# Patient Record
Sex: Female | Born: 1990 | Hispanic: Yes | Marital: Single | Smoking: Never smoker
Health system: Southern US, Community
[De-identification: ages and names within clinical notes are randomized; demographics above are authoritative.]

## PROBLEM LIST (undated history)

## (undated) ENCOUNTER — Inpatient Hospital Stay (HOSPITAL_COMMUNITY): Payer: Self-pay

## (undated) DIAGNOSIS — IMO0002 Reserved for concepts with insufficient information to code with codable children: Secondary | ICD-10-CM

## (undated) DIAGNOSIS — Z973 Presence of spectacles and contact lenses: Secondary | ICD-10-CM

## (undated) DIAGNOSIS — M797 Fibromyalgia: Secondary | ICD-10-CM

## (undated) DIAGNOSIS — R87619 Unspecified abnormal cytological findings in specimens from cervix uteri: Secondary | ICD-10-CM

## (undated) DIAGNOSIS — D649 Anemia, unspecified: Secondary | ICD-10-CM

## (undated) DIAGNOSIS — F419 Anxiety disorder, unspecified: Secondary | ICD-10-CM

## (undated) HISTORY — PX: APPENDECTOMY: SHX54

## (undated) HISTORY — PX: INTRAUTERINE DEVICE INSERTION: SHX323

---
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
## 2010-09-08 ENCOUNTER — Encounter: Payer: Self-pay | Admitting: Obstetrics & Gynecology

## 2011-02-01 LAB — URINALYSIS, ROUTINE W REFLEX MICROSCOPIC
Glucose, UA: NEGATIVE
Leukocytes, UA: NEGATIVE
Protein, ur: NEGATIVE
Specific Gravity, Urine: >1.03 — ABNORMAL HIGH
Urobilinogen, UA: 0.2

## 2011-02-01 LAB — URINE MICROSCOPIC-ADD ON

## 2011-02-01 LAB — GC/CHLAMYDIA PROBE AMP, GENITAL
Chlamydia, DNA Probe: NEGATIVE
GC Probe Amp, Genital: NEGATIVE

## 2011-02-01 LAB — WET PREP, GENITAL
Clue Cells Wet Prep HPF POC: NONE SEEN
Trich, Wet Prep: NONE SEEN

## 2011-03-03 ENCOUNTER — Emergency Department (HOSPITAL_COMMUNITY): Payer: Self-pay | Attending: Emergency Medicine

## 2011-03-03 ENCOUNTER — Emergency Department (HOSPITAL_COMMUNITY)
Admission: EM | Admit: 2011-03-03 | Discharge: 2011-03-03 | Disposition: A | Discharge status: Home / Self Care | Payer: Self-pay | Source: Home / Self Care | Attending: Emergency Medicine | Admitting: Emergency Medicine

## 2011-03-03 DIAGNOSIS — M546 Pain in thoracic spine: Secondary | ICD-10-CM | POA: Insufficient documentation

## 2011-03-03 DIAGNOSIS — Z8541 Personal history of malignant neoplasm of cervix uteri: Secondary | ICD-10-CM | POA: Insufficient documentation

## 2011-03-03 DIAGNOSIS — G8929 Other chronic pain: Secondary | ICD-10-CM | POA: Insufficient documentation

## 2011-05-10 NOTE — L&D Delivery Note (Signed)
Delivery Note Progressed quickly to complete dilation and felt urge to push.  At 7:12 AM a viable and healthy female was delivered via Vaginal, Spontaneous Delivery (Presentation: ; Occiput Anterior).  APGAR: 8, 9; weight .   Placenta status: Intact, Spontaneous.  Cord: 3 vessels with the following complications: None.   No difficulty with shoulders.  Anesthesia: None  Episiotomy: None Lacerations:  Abrasion post. Midline at introitus Suture Repair: none Est. Blood Loss (mL): 100  Mom to postpartum.  Baby to nursery-stable.  Tanya Erickson,Tanya Erickson 03/09/2012, 8:00 AM

## 2011-09-21 ENCOUNTER — Other Ambulatory Visit (HOSPITAL_COMMUNITY): Payer: Self-pay | Admitting: Physician Assistant

## 2011-09-21 DIAGNOSIS — Z3689 Encounter for other specified antenatal screening: Secondary | ICD-10-CM

## 2011-09-21 LAB — OB RESULTS CONSOLE RUBELLA ANTIBODY, IGM: Rubella: IMMUNE

## 2011-09-28 ENCOUNTER — Ambulatory Visit (HOSPITAL_COMMUNITY)
Admission: RE | Admit: 2011-09-28 | Discharge: 2011-09-28 | Disposition: A | Discharge status: Home / Self Care | Payer: Medicaid Other | Source: Ambulatory Visit | Attending: Physician Assistant | Admitting: Physician Assistant

## 2011-09-28 DIAGNOSIS — O365990 Maternal care for other known or suspected poor fetal growth, unspecified trimester, not applicable or unspecified: Secondary | ICD-10-CM | POA: Insufficient documentation

## 2011-09-28 DIAGNOSIS — Z3689 Encounter for other specified antenatal screening: Secondary | ICD-10-CM | POA: Insufficient documentation

## 2011-09-29 ENCOUNTER — Other Ambulatory Visit (HOSPITAL_COMMUNITY): Payer: Self-pay | Admitting: Physician Assistant

## 2011-09-29 DIAGNOSIS — Z1389 Encounter for screening for other disorder: Secondary | ICD-10-CM

## 2011-10-01 ENCOUNTER — Ambulatory Visit (HOSPITAL_COMMUNITY)
Admission: AD | Admit: 2011-10-01 | Discharge: 2011-10-03 | Disposition: A | Discharge status: Home / Self Care | Payer: Medicaid Other | Source: Ambulatory Visit | Attending: Surgery | Admitting: Surgery

## 2011-10-01 ENCOUNTER — Encounter (HOSPITAL_COMMUNITY): Payer: Self-pay | Admitting: *Deleted

## 2011-10-01 ENCOUNTER — Inpatient Hospital Stay (HOSPITAL_COMMUNITY): Payer: Medicaid Other

## 2011-10-01 DIAGNOSIS — O99891 Other specified diseases and conditions complicating pregnancy: Secondary | ICD-10-CM | POA: Insufficient documentation

## 2011-10-01 DIAGNOSIS — Z531 Procedure and treatment not carried out because of patient's decision for reasons of belief and group pressure: Secondary | ICD-10-CM | POA: Insufficient documentation

## 2011-10-01 DIAGNOSIS — K3580 Unspecified acute appendicitis: Secondary | ICD-10-CM | POA: Insufficient documentation

## 2011-10-01 DIAGNOSIS — R109 Unspecified abdominal pain: Secondary | ICD-10-CM | POA: Insufficient documentation

## 2011-10-01 HISTORY — DX: Reserved for concepts with insufficient information to code with codable children: IMO0002

## 2011-10-01 HISTORY — DX: Unspecified abnormal cytological findings in specimens from cervix uteri: R87.619

## 2011-10-01 LAB — CBC
HCT: 33.5 % — ABNORMAL LOW (ref 36.0–46.0)
MCHC: 34.3 g/dL (ref 30.0–36.0)
MCV: 88.4 fL (ref 78.0–100.0)
RDW: 12.7 % (ref 11.5–15.5)

## 2011-10-01 LAB — WET PREP, GENITAL: Trich, Wet Prep: NONE SEEN

## 2011-10-01 LAB — URINE MICROSCOPIC-ADD ON

## 2011-10-01 LAB — COMPREHENSIVE METABOLIC PANEL
Albumin: 3 g/dL — ABNORMAL LOW (ref 3.5–5.2)
BUN: 8 mg/dL (ref 6–23)
Creatinine, Ser: 0.67 mg/dL (ref 0.50–1.10)
Total Bilirubin: 0.2 mg/dL — ABNORMAL LOW (ref 0.3–1.2)
Total Protein: 6.2 g/dL (ref 6.0–8.3)

## 2011-10-01 LAB — URINALYSIS, ROUTINE W REFLEX MICROSCOPIC
Glucose, UA: NEGATIVE mg/dL
Protein, ur: NEGATIVE mg/dL
pH: 7 (ref 5.0–8.0)

## 2011-10-01 MED ORDER — LACTATED RINGERS IV SOLN
INTRAVENOUS | Status: DC
  Administered 2011-10-01: via INTRAVENOUS

## 2011-10-01 MED ORDER — GI COCKTAIL ~~LOC~~
30.0000 mL | Freq: Once | ORAL | Status: AC
  Administered 2011-10-01: 30 mL via ORAL
  Filled 2011-10-01: qty 30

## 2011-10-01 NOTE — MAU Note (Signed)
Pt states at 1710 she felt nauseated and vomited.  This was followed by a constant abdominal pain bringing her to tears.

## 2011-10-01 NOTE — MAU Provider Note (Signed)
History     CSN: [id number]  Arrival date and time: 10/01/11 1756   First Provider Initiated Contact with Patient 10/01/11 1912      Chief Complaint  Patient presents with  . Abdominal Pain   HPI 21yo G2P1001 @ 17'0 by 16wk [place] c/o acute onset n/v/abd pain.  At 1700 had severe episode of n/v, multiple episodes, non-bloody.  At 1710, severe, sharp, stabbing abd pain started in periumbilical region.  Relatively constant since then.  Radiates to right side, right back, and sometimes down the legs.  No left sided pain.  Has not had pain like this before.  Took no pain meds.  No further n/v.    No recent heavy tylenol use, alcohol use, unusual/rare foods, unsafe water sources, sick contacts, or travel.  No fevers, chills, rashes, HA, neck stiffness, vision changes, respiratory symptoms, abnl vaginal discharge, urinary symptoms, VB, LOF or ctx.  Has not had any abd or pelvic surgeries.     OB History    Grav Para Term Preterm Abortions TAB SAB Ect Mult Living   2 1 1       1       Past Medical History  Diagnosis Date  . Abnormal Pap smear     History reviewed. No pertinent past surgical history.  Family History  Problem Relation Age of Onset  . Cancer Sister   . Heart disease Paternal Uncle   . Diabetes Maternal Grandmother   . Diabetes Maternal Grandfather   . Heart disease Paternal Grandfather     History  Substance Use Topics  . Smoking status: Never Smoker   . Smokeless tobacco: Not on file  . Alcohol Use: No    Allergies: No Known Allergies  No prescriptions prior to admission    Review of Systems  Constitutional: Negative for fever and chills.  HENT: Negative for sore throat.        Mild, intermittent HA x 2 wks, no medications for it.  Eyes: Negative for blurred vision and double vision.  Respiratory: Negative for cough and shortness of breath.   Cardiovascular: Negative for chest pain and palpitations.  Gastrointestinal: Positive for nausea, vomiting and  abdominal pain. Negative for diarrhea, constipation and blood in stool.  Genitourinary: Negative for dysuria, urgency and hematuria.  Musculoskeletal: Negative for myalgias and joint pain.  Skin: Negative for itching and rash.  Neurological: Negative for dizziness, focal weakness, seizures and loss of consciousness.  Endo/Heme/Allergies: Negative for polydipsia. Does not bruise/bleed easily.  Psychiatric/Behavioral: Negative for depression. The patient is not nervous/anxious.    Physical Exam   Blood pressure 117/69, pulse 89, temperature 97.1 F (36.2 C), temperature source Oral, resp. rate 24, last menstrual period 05/21/2011.  Physical Exam  Constitutional: She appears well-developed. No distress.  HENT:  Head: Normocephalic and atraumatic.  Eyes: Conjunctivae and EOM are normal.  Neck: Normal range of motion. Neck supple.  Cardiovascular: Normal rate, regular rhythm and normal heart sounds.   Respiratory: Effort normal and breath sounds normal.  GI: There is no rebound and no guarding.       Soft, non-distended, no organomegaly, +bowel sounds.  Tenderness to palpation over suprapubic region, fundus (just inferior to umbilicus), RLQ, RUQ and epigastric region.  Musculoskeletal: Normal range of motion. She exhibits no edema.       NO CVA tenderness.  Skin: Skin is warm. She is not diaphoretic.  Psychiatric: She has a normal mood and affect. Her behavior is normal.    MAU Course  Procedures   Assessment and Plan  21yo G2P1001 @ 17'0 by 16wk [place] c/o acute onset n/v/abd pain.  N/v/abd pain: DDx includes pancreas/ovarian pathology, UTI.  Could be severe GERD/gastritis/evolving gastroenteritis.   Unlikely related to pregnancy, though could be severe round ligament pain.  CMP and CBC WNL.  Unlikely appendicitis given afebrile with normal WBCs or gallbladder/liver pathology given nl CMP.  UA, lipase/amylase and pelvic US pending.  FWB: normal FHR on doppler today.    Dispo: pending  [place] results.  Chancy Hurter MD  Care transitioned to [facility] CNM   Assumed care of patient; upon initial inspection pt continues to have right abdominal pain; ambulates gingerly; tenderness with palpation of right side of abdomen; Vaginal exam - cervix closed; white frothy discharge; uncomfortable with exam.  Wet prep:  Trich neg; yeast - neg; clue - many; bacteria - too numerous to count  Results:  Results for orders placed during the hospital encounter of 10/01/11 (from the past 24 hour(s))  AMYLASE     Status: Normal   Collection Time   10/01/11  6:57 PM      Component Value Range   Amylase 51  0 - 105 (U/L)  LIPASE, BLOOD     Status: Normal   Collection Time   10/01/11  6:57 PM      Component Value Range   Lipase 40  11 - 59 (U/L)  CBC     Status: Abnormal   Collection Time   10/01/11  6:57 PM      Component Value Range   WBC 9.1  4.0 - 10.5 (K/uL)   RBC 3.79 (*) 3.87 - 5.11 (MIL/uL)   Hemoglobin 11.5 (*) 12.0 - 15.0 (g/dL)   HCT 16.1 (*) 09.6 - 46.0 (%)   MCV 88.4  78.0 - 100.0 (fL)   MCH 30.3  26.0 - 34.0 (pg)   MCHC 34.3  30.0 - 36.0 (g/dL)   RDW 04.5  40.9 - 81.1 (%)   Platelets 234  150 - 400 (K/uL)  COMPREHENSIVE METABOLIC PANEL     Status: Abnormal   Collection Time   10/01/11  6:57 PM      Component Value Range   Sodium 137  135 - 145 (mEq/L)   Potassium 3.6  3.5 - 5.1 (mEq/L)   Chloride 105  96 - 112 (mEq/L)   CO2 21  19 - 32 (mEq/L)   Glucose, Bld 76  70 - 99 (mg/dL)   BUN 8  6 - 23 (mg/dL)   Creatinine, Ser 9.14  0.50 - 1.10 (mg/dL)   Calcium 8.8  8.4 - 78.2 (mg/dL)   Total Protein 6.2  6.0 - 8.3 (g/dL)   Albumin 3.0 (*) 3.5 - 5.2 (g/dL)   AST 13  0 - 37 (U/L)   ALT 12  0 - 35 (U/L)   Alkaline Phosphatase 53  39 - 117 (U/L)   Total Bilirubin 0.2 (*) 0.3 - 1.2 (mg/dL)   GFR calc non Af Amer >90  >90 (mL/min)   GFR calc Af Amer >90  >90 (mL/min)   Consulted with Dr. Despina Hidden and reviewed pt HPI/Exam>since pt continues to have significant pain  R/O appendicitis  Call from radiologist at [facility] confirmed early appendicitis>Dr. Despina Hidden assumes care of patient.  [facility] 10/01/2011, 7:34 PM

## 2011-10-02 ENCOUNTER — Encounter (HOSPITAL_COMMUNITY): Payer: Self-pay | Admitting: Anesthesiology

## 2011-10-02 ENCOUNTER — Inpatient Hospital Stay (HOSPITAL_COMMUNITY): Payer: Medicaid Other | Admitting: Anesthesiology

## 2011-10-02 ENCOUNTER — Inpatient Hospital Stay (HOSPITAL_COMMUNITY)
Admit: 2011-10-02 | Discharge: 2011-10-02 | Disposition: A | Discharge status: Home / Self Care | Payer: Medicaid Other | Attending: Family | Admitting: Family

## 2011-10-02 ENCOUNTER — Encounter (HOSPITAL_COMMUNITY): Admission: AD | Disposition: A | Discharge status: Home / Self Care | Payer: Self-pay | Source: Ambulatory Visit

## 2011-10-02 ENCOUNTER — Encounter (HOSPITAL_COMMUNITY): Payer: Self-pay | Admitting: Family

## 2011-10-02 DIAGNOSIS — K3580 Unspecified acute appendicitis: Secondary | ICD-10-CM

## 2011-10-02 HISTORY — PX: LAPAROSCOPIC APPENDECTOMY: SHX408

## 2011-10-02 SURGERY — APPENDECTOMY, LAPAROSCOPIC
Anesthesia: General | Site: Abdomen | Wound class: Contaminated

## 2011-10-02 MED ORDER — SODIUM CHLORIDE 0.9 % IV SOLN
3.0000 g | Freq: Once | INTRAVENOUS | Status: AC
  Administered 2011-10-02: 3 g via INTRAVENOUS
  Filled 2011-10-02: qty 3

## 2011-10-02 MED ORDER — VECURONIUM BROMIDE 10 MG IV SOLR
INTRAVENOUS | Status: DC | PRN
  Administered 2011-10-02: 4 mg via INTRAVENOUS
  Administered 2011-10-02: 2 mg via INTRAVENOUS

## 2011-10-02 MED ORDER — SODIUM CHLORIDE 0.9 % IR SOLN
Status: DC | PRN
  Administered 2011-10-02: 1

## 2011-10-02 MED ORDER — ONDANSETRON HCL 4 MG/2ML IJ SOLN
4.0000 mg | Freq: Four times a day (QID) | INTRAMUSCULAR | Status: DC | PRN
  Administered 2011-10-02: 4 mg via INTRAVENOUS
  Filled 2011-10-02: qty 2

## 2011-10-02 MED ORDER — NEOSTIGMINE METHYLSULFATE 1 MG/ML IJ SOLN
INTRAMUSCULAR | Status: DC | PRN
  Administered 2011-10-02: 5 mg via INTRAVENOUS

## 2011-10-02 MED ORDER — KCL IN DEXTROSE-NACL 20-5-0.45 MEQ/L-%-% IV SOLN
INTRAVENOUS | Status: DC
  Administered 2011-10-02 (×2): via INTRAVENOUS
  Filled 2011-10-02 (×4): qty 1000

## 2011-10-02 MED ORDER — DROPERIDOL 2.5 MG/ML IJ SOLN
INTRAMUSCULAR | Status: DC | PRN
  Administered 2011-10-02: 0.625 mg via INTRAVENOUS

## 2011-10-02 MED ORDER — MIDAZOLAM HCL 5 MG/5ML IJ SOLN
INTRAMUSCULAR | Status: DC | PRN
  Administered 2011-10-02: 2 mg via INTRAVENOUS

## 2011-10-02 MED ORDER — LIDOCAINE HCL (CARDIAC) 20 MG/ML IV SOLN
INTRAVENOUS | Status: DC | PRN
  Administered 2011-10-02: 100 mg via INTRAVENOUS

## 2011-10-02 MED ORDER — LACTATED RINGERS IV SOLN
INTRAVENOUS | Status: DC | PRN
  Administered 2011-10-02: 05:00:00 via INTRAVENOUS

## 2011-10-02 MED ORDER — PROPOFOL 10 MG/ML IV EMUL
INTRAVENOUS | Status: DC | PRN
  Administered 2011-10-02: 200 mg via INTRAVENOUS

## 2011-10-02 MED ORDER — SODIUM CHLORIDE 0.9 % IV SOLN: 3.0000 g | Freq: Once | INTRAVENOUS | Status: DC

## 2011-10-02 MED ORDER — ACETAMINOPHEN 325 MG PO TABS
650.0000 mg | ORAL_TABLET | ORAL | Status: DC | PRN
  Administered 2011-10-02: 650 mg via ORAL
  Filled 2011-10-02: qty 2

## 2011-10-02 MED ORDER — FENTANYL CITRATE 0.05 MG/ML IJ SOLN
INTRAMUSCULAR | Status: DC | PRN
  Administered 2011-10-02: 50 ug via INTRAVENOUS
  Administered 2011-10-02: 100 ug via INTRAVENOUS
  Administered 2011-10-02: 50 ug via INTRAVENOUS
  Administered 2011-10-02: 150 ug via INTRAVENOUS

## 2011-10-02 MED ORDER — SODIUM CHLORIDE 0.9 % IV SOLN
3.0000 g | Freq: Four times a day (QID) | INTRAVENOUS | Status: AC
  Administered 2011-10-02: 3 g via INTRAVENOUS
  Filled 2011-10-02: qty 3

## 2011-10-02 MED ORDER — METOCLOPRAMIDE HCL 5 MG/ML IJ SOLN
INTRAMUSCULAR | Status: DC | PRN
  Administered 2011-10-02: 10 mg via INTRAVENOUS

## 2011-10-02 MED ORDER — SODIUM CHLORIDE 0.9 % IV SOLN
INTRAVENOUS | Status: DC | PRN
  Administered 2011-10-02: 04:00:00 via INTRAVENOUS

## 2011-10-02 MED ORDER — KCL IN DEXTROSE-NACL 20-5-0.45 MEQ/L-%-% IV SOLN
INTRAVENOUS | Status: AC
  Filled 2011-10-02: qty 1000

## 2011-10-02 MED ORDER — DEXAMETHASONE SODIUM PHOSPHATE 4 MG/ML IJ SOLN
INTRAMUSCULAR | Status: DC | PRN
  Administered 2011-10-02: 4 mg via INTRAVENOUS

## 2011-10-02 MED ORDER — ONDANSETRON HCL 4 MG PO TABS: 4.0000 mg | ORAL_TABLET | Freq: Four times a day (QID) | ORAL | Status: DC | PRN

## 2011-10-02 MED ORDER — HYDROMORPHONE HCL PF 1 MG/ML IJ SOLN
0.2500 mg | INTRAMUSCULAR | Status: DC | PRN
  Administered 2011-10-02 (×2): 0.5 mg via INTRAVENOUS

## 2011-10-02 MED ORDER — OXYCODONE-ACETAMINOPHEN 5-325 MG PO TABS
1.0000 | ORAL_TABLET | ORAL | Status: DC | PRN
  Administered 2011-10-02 (×2): 2 via ORAL
  Administered 2011-10-03: 1 via ORAL
  Filled 2011-10-02: qty 2
  Filled 2011-10-02: qty 1
  Filled 2011-10-02: qty 2

## 2011-10-02 MED ORDER — BUPIVACAINE-EPINEPHRINE 0.25% -1:200000 IJ SOLN
INTRAMUSCULAR | Status: DC | PRN
  Administered 2011-10-02: 3 mL

## 2011-10-02 MED ORDER — MORPHINE SULFATE 2 MG/ML IJ SOLN: 2.0000 mg | INTRAMUSCULAR | Status: DC | PRN

## 2011-10-02 MED ORDER — SUCCINYLCHOLINE CHLORIDE 20 MG/ML IJ SOLN
INTRAMUSCULAR | Status: DC | PRN
  Administered 2011-10-02: 100 mg via INTRAVENOUS

## 2011-10-02 MED ORDER — ONDANSETRON HCL 4 MG/2ML IJ SOLN
INTRAMUSCULAR | Status: DC | PRN
  Administered 2011-10-02: 4 mg via INTRAVENOUS

## 2011-10-02 MED ORDER — GLYCOPYRROLATE 0.2 MG/ML IJ SOLN
INTRAMUSCULAR | Status: DC | PRN
  Administered 2011-10-02: .6 mg via INTRAVENOUS

## 2011-10-02 MED ORDER — ONDANSETRON HCL 4 MG/2ML IJ SOLN: 4.0000 mg | Freq: Once | INTRAMUSCULAR | Status: DC | PRN

## 2011-10-02 SURGICAL SUPPLY — 46 items
APPLIER CLIP ROT 10 11.4 M/L (STAPLE)
BENZOIN TINCTURE PRP APPL 2/3 (GAUZE/BANDAGES/DRESSINGS) ×2 IMPLANT
BLADE SURG 15 STRL LF DISP TIS (BLADE) ×1 IMPLANT
BLADE SURG 15 STRL SS (BLADE) ×1
BLADE SURG ROTATE 9660 (MISCELLANEOUS) ×4 IMPLANT
CANISTER SUCTION 2500CC (MISCELLANEOUS) IMPLANT
CHLORAPREP W/TINT 26ML (MISCELLANEOUS) ×2 IMPLANT
CLIP APPLIE ROT 10 11.4 M/L (STAPLE) IMPLANT
CLOTH BEACON ORANGE TIMEOUT ST (SAFETY) IMPLANT
CLSR STERI-STRIP ANTIMIC 1/2X4 (GAUZE/BANDAGES/DRESSINGS) ×2 IMPLANT
COVER SURGICAL LIGHT HANDLE (MISCELLANEOUS) ×2 IMPLANT
CUTTER FLEX LINEAR 45M (STAPLE) ×2 IMPLANT
DECANTER SPIKE VIAL GLASS SM (MISCELLANEOUS) IMPLANT
DRAPE UTILITY 15X26 W/TAPE STR (DRAPE) ×4 IMPLANT
DRESSING OPSITE X SMALL 2X3 (GAUZE/BANDAGES/DRESSINGS) ×2 IMPLANT
DRSG TEGADERM 4X4.75 (GAUZE/BANDAGES/DRESSINGS) ×2 IMPLANT
ELECT REM PT RETURN 9FT ADLT (ELECTROSURGICAL) ×2
ELECTRODE REM PT RTRN 9FT ADLT (ELECTROSURGICAL) ×1 IMPLANT
ENDOLOOP SUT PDS II  0 18 (SUTURE)
ENDOLOOP SUT PDS II 0 18 (SUTURE) IMPLANT
FILTER SMOKE EVAC LAPAROSHD (FILTER) ×2 IMPLANT
GAUZE SPONGE 2X2 8PLY STRL LF (GAUZE/BANDAGES/DRESSINGS) ×1 IMPLANT
GLOVE BIO SURGEON STRL SZ7 (GLOVE) ×2 IMPLANT
GLOVE BIOGEL PI IND STRL 7.5 (GLOVE) ×2 IMPLANT
GLOVE BIOGEL PI INDICATOR 7.5 (GLOVE) ×2
GOWN STRL NON-REIN LRG LVL3 (GOWN DISPOSABLE) ×4 IMPLANT
KIT BASIN OR (CUSTOM PROCEDURE TRAY) ×2 IMPLANT
KIT ROOM TURNOVER OR (KITS) ×2 IMPLANT
MANIFOLD NEPTUNE II (INSTRUMENTS) ×2 IMPLANT
NS IRRIG 1000ML POUR BTL (IV SOLUTION) ×2 IMPLANT
PAD ARMBOARD 7.5X6 YLW CONV (MISCELLANEOUS) ×4 IMPLANT
PENCIL BUTTON HOLSTER BLD 10FT (ELECTRODE) ×2 IMPLANT
POUCH SPECIMEN RETRIEVAL 10MM (ENDOMECHANICALS) ×2 IMPLANT
RELOAD STAPLE TA45 3.5 REG BLU (ENDOMECHANICALS) ×2 IMPLANT
SCALPEL HARMONIC ACE (MISCELLANEOUS) ×2 IMPLANT
SET IRRIG TUBING LAPAROSCOPIC (IRRIGATION / IRRIGATOR) ×2 IMPLANT
SPECIMEN JAR SMALL (MISCELLANEOUS) ×2 IMPLANT
SPONGE GAUZE 2X2 STER 10/PKG (GAUZE/BANDAGES/DRESSINGS) ×1
SUT MNCRL AB 4-0 PS2 18 (SUTURE) ×2 IMPLANT
TOWEL OR 17X24 6PK STRL BLUE (TOWEL DISPOSABLE) ×2 IMPLANT
TOWEL OR 17X26 10 PK STRL BLUE (TOWEL DISPOSABLE) ×2 IMPLANT
TRAY FOLEY CATH 14FR (SET/KITS/TRAYS/PACK) ×2 IMPLANT
TRAY LAPAROSCOPIC (CUSTOM PROCEDURE TRAY) ×2 IMPLANT
TROCAR XCEL BLADELESS 5X75MML (TROCAR) ×4 IMPLANT
TROCAR XCEL BLUNT TIP 100MML (ENDOMECHANICALS) ×2 IMPLANT
WATER STERILE IRR 1000ML POUR (IV SOLUTION) IMPLANT

## 2011-10-02 NOTE — Anesthesia Procedure Notes (Signed)
Procedure Name: Intubation Date/Time: 10/02/2011 4:20 AM Performed by: Wray Kearns A Pre-anesthesia Checklist: Patient identified, Timeout performed, Emergency Drugs available, Suction available and Patient being monitored Patient Re-evaluated:Patient Re-evaluated prior to inductionOxygen Delivery Method: Circle system utilized Preoxygenation: Pre-oxygenation with 100% oxygen Intubation Type: IV induction, Rapid sequence and Cricoid Pressure applied Ventilation: Mask ventilation without difficulty Laryngoscope Size: Mac and 3 Grade View: Grade I Tube type: Oral Number of attempts: 1 Airway Equipment and Method: Stylet Placement Confirmation: ETT inserted through vocal cords under direct vision,  breath sounds checked- equal and bilateral and positive ETCO2 Secured at: 22 cm Tube secured with: Tape Dental Injury: Teeth and Oropharynx as per pre-operative assessment

## 2011-10-02 NOTE — Transfer of Care (Signed)
Immediate Anesthesia Transfer of Care Note  Patient: Tanya Erickson  Procedure(s) Performed: Procedure(s) (LRB): APPENDECTOMY LAPAROSCOPIC (N/A)  Patient Location: PACU  Anesthesia Type: General  Level of Consciousness: oriented, sedated, patient cooperative and responds to stimulation  Airway & Oxygen Therapy: Patient Spontanous Breathing and Patient connected to nasal cannula oxygen  Post-op Assessment: Report given to PACU RN, Post -op Vital signs reviewed and stable, Patient moving all extremities and Patient moving all extremities X 4  Post vital signs: Reviewed and stable  Complications: No apparent anesthesia complications

## 2011-10-02 NOTE — Op Note (Signed)
Appendectomy, Lap, Procedure Note  Indications: The patient presented with a history of right-sided abdominal pain. A MRI revealed findings consistent with acute appendicitis.  The patient is [redacted] weeks pregnant  Pre-operative Diagnosis: Acute appendicitis without mention of peritonitis  Post-operative Diagnosis: Same  Surgeon: Jesusa Stenerson K.   Assistants: none  Anesthesia: General endotracheal anesthesia  ASA Class: 2  Procedure Details  The patient was seen again in the Holding Room. The risks, benefits, complications, treatment options, and expected outcomes were discussed with the patient and/or family. The possibilities of reaction to medication, perforation of viscus, bleeding, recurrent infection, finding a normal appendix, the need for additional procedures, failure to diagnose a condition, and creating a complication requiring transfusion or operation were discussed. There was concurrence with the proposed plan and informed consent was obtained. The site of surgery was properly noted. The patient was taken to Operating Room, identified as Tanya Erickson and the procedure verified as Appendectomy. A Time Out was held and the above information confirmed.  The patient was placed in the supine position and general anesthesia was induced.  The abdomen was prepped and draped in a sterile fashion. A one centimeter supraumbilical incision was made.  Dissection was carried down to the fascia bluntly.  The fascia was incised vertically.  We entered the peritoneal cavity bluntly.  A pursestring suture was passed around the incision with a 0 Vicryl.  The Hasson cannula was introduced into the abdomen and the tails of the suture were used to hold the Hasson in place.   The pneumoperitoneum was then established maintaining a maximum pressure of 15 mmHg.  Additional 5 mm cannulas then placed in the left side of the abdomen and the right upper quadrant under direct visualization. A careful evaluation of  the entire abdomen was carried out. The patient was placed in Trendelenburg and left lateral decubitus position.  The scope was moved to the right upper quadrant port site. The cecum was mobilized medially.  The appendix was quite long and mildly inflamed.  It was adherent to the retroperitoneum. The appendix was carefully dissected. The appendix was then skeletonized with the harmonic scalpel.   The appendix was divided at its base using an endo-GIA stapler. Minimal appendiceal stump was left in place. There was no evidence of bleeding, leakage, or complication after division of the appendix. Irrigation was also performed and irrigate suctioned from the abdomen as well.  The uterus was obviously gravid, but no abnormalities were noted.  The umbilical port site was closed with the purse string suture. There was no residual palpable fascial defect.  The trocar site skin wounds were closed with 4-0 Monocryl.  Instrument, sponge, and needle counts were correct at the conclusion of the case.   Findings: The appendix was found to be inflamed. There were not signs of necrosis.  There was not perforation. There was not abscess formation.  Estimated Blood Loss:  Minimal         Drains: none         Specimens: appendix         Complications:  None; patient tolerated the procedure well.         Disposition: PACU - hemodynamically stable.         Condition: stable  [name]. Corliss Skains, MD, [facility]  10/02/2011 5:50 AM

## 2011-10-02 NOTE — Progress Notes (Signed)
Pt id Jehovah witness and requested no blood blood or blood product.

## 2011-10-02 NOTE — Progress Notes (Signed)
Day of Surgery  Subjective: R shoulder pain, tolerating clears  Objective: Vital signs in last 24 hours: Temp:  [96.7 F (35.9 C)-98.1 F (36.7 C)] 98.1 F (36.7 C) (05/26 0954) Pulse Rate:  [68-89] 81  (05/26 0954) Resp:  [15-24] 17  (05/26 0954) BP: (103-133)/(50-79) 103/50 mmHg (05/26 0954) SpO2:  [96 %-100 %] 96 % (05/26 0954) Weight:  [77.837 kg (171 lb 9.6 oz)] 77.837 kg (171 lb 9.6 oz) (05/26 1610)    Intake/Output from previous day: 05/25 0701 - 05/26 0700 In: 2300 [I.V.:2300] Out: 400 [Urine:400] Intake/Output this shift:    General appearance: alert and cooperative GI: soft, dressings CDI, not sig tender  Lab Results:   [facility] 10/01/11 1857  WBC 9.1  HGB 11.5*  HCT 33.5*  PLT 234   BMET  Basename 10/01/11 1857  NA 137  K 3.6  CL 105  CO2 21  GLUCOSE 76  BUN 8  CREATININE 0.67  CALCIUM 8.8   PT/INR No results found for this basename: LABPROT:2,INR:2 in the last 72 hours ABG No results found for this basename: PHART:2,PCO2:2,PO2:2,HCO3:2 in the last 72 hours  Studies/Results: Mr Abdomen Wo Contrast  10/02/2011  *RADIOLOGY REPORT*  Clinical Data: Severe right-sided periumbilical abdominal pain. Clinical suspicion for appendicitis.  MRI ABDOMEN WITHOUT CONTRAST  Technique:  Multiplanar multisequence MR imaging of the abdomen was performed. No intravenous contrast was administered.  Comparison: None.  Findings: A single intrauterine fetus is seen.  The abdominal parenchymal organs are normal in appearance.  Gallbladder is unremarkable.  There is no evidence of biliary ductal dilatation. No abdominal soft tissue masses or lymphadenopathy identified.  The appendix is mildly enlarged, measuring approximately 8mm in diameter.  There is mild periappendiceal edema. These findings are consistent with early appendicitis.  No evidence of abscess.  No evidence of bowel obstruction.  IMPRESSION:  1.  Findings consistent with early appendicitis. 2.  No evidence of  abscess. 3.  Single intrauterine fetus.  These results were called by telephone on 10/02/2011  at  0150 hours to  Dr. Despina Hidden, who verbally acknowledged these results.  Original Report Authenticated By: Danae Orleans, M.D.   US Ob Limited  10/01/2011  OBSTETRICAL ULTRASOUND: This exam was performed within a [place] [facility]. The OB US report was generated in the AS system, and faxed to the ordering physician.   This report is also available in [employer] and in the [employer]. See AS Obstetric US report.    Anti-infectives: Anti-infectives     Start     Dose/Rate Route Frequency Ordered Stop   10/02/11 1000   Ampicillin-Sulbactam (UNASYN) 3 g in sodium chloride 0.9 % 100 mL IVPB        3 g 100 mL/hr over 60 Minutes Intravenous Every 6 hours 10/02/11 0637 10/02/11 1102   10/02/11 0645   Ampicillin-Sulbactam (UNASYN) 3 g in sodium chloride 0.9 % 100 mL IVPB  Status:  Discontinued        3 g 100 mL/hr over 60 Minutes Intravenous  Once 10/02/11 0637 10/02/11 0648   10/02/11 0318   Ampicillin-Sulbactam (UNASYN) 3 g in sodium chloride 0.9 % 100 mL IVPB        3 g 100 mL/hr over 60 Minutes Intravenous  Once 10/02/11 0318 10/02/11 0429          Assessment/Plan: s/p Procedure(s) (LRB): APPENDECTOMY LAPAROSCOPIC (N/A) POD#0 Pain control - I explained likely cause of R shoulder pain being diaphragmatic irritation Advance diet  Plan D/C in AM  LOS: 1 day    Tanya Erickson E 10/02/2011

## 2011-10-02 NOTE — ED Notes (Signed)
Md in room talking to family and pt now.

## 2011-10-02 NOTE — ED Notes (Signed)
Pt received from MRI via [facility]  Report on arrival by [facility] pt received 1mg  dilaudid and 4 mg Zofran both IV  Given per EMS

## 2011-10-02 NOTE — ED Notes (Signed)
Pt going to surgery

## 2011-10-02 NOTE — Progress Notes (Signed)
Doppler post appendectomy.  Pt still drowsy from anesthesia and recently receiving IV pain medication.  + FM noted during monitoring w/ doppler range 122-126 bpm

## 2011-10-02 NOTE — H&P (Signed)
Tanya Erickson is an 21 y.o. female.   Chief Complaint: Nausea, vomiting, RLQ pain HPI: 21 yo female in good health presents in the 17th week of her pregnancy with acute onset 10 hours ago of nausea, vomiting, periumbilical abdominal pain that has migrated to her RLQ.  She is very tender in her RLQ.  She presented to the [facility] ED for evaluation and was sent to [facility] for MRI.  This confirmed appendicitis, so we were consulted for appendectomy  Past Medical History  Diagnosis Date  . Abnormal Pap smear     History reviewed. No pertinent past surgical history.  Family History  Problem Relation Age of Onset  . Cancer Sister   . Heart disease Paternal Uncle   . Diabetes Maternal Grandmother   . Diabetes Maternal Grandfather   . Heart disease Paternal Grandfather    Social History:  reports that she has never smoked. She does not have any smokeless tobacco history on file. She reports that she does not drink alcohol or use illicit drugs.  Allergies: No Known Allergies   (Not in a hospital admission)  Results for orders placed during the hospital encounter of 10/01/11 (from the past 48 hour(s))  AMYLASE     Status: Normal   Collection Time   10/01/11  6:57 PM      Component Value Range Comment   Amylase 51  0 - 105 (U/L)   LIPASE, BLOOD     Status: Normal   Collection Time   10/01/11  6:57 PM      Component Value Range Comment   Lipase 40  11 - 59 (U/L)   CBC     Status: Abnormal   Collection Time   10/01/11  6:57 PM      Component Value Range Comment   WBC 9.1  4.0 - 10.5 (K/uL)    RBC 3.79 (*) 3.87 - 5.11 (MIL/uL)    Hemoglobin 11.5 (*) 12.0 - 15.0 (g/dL)    HCT 40.9 (*) 81.1 - 46.0 (%)    MCV 88.4  78.0 - 100.0 (fL)    MCH 30.3  26.0 - 34.0 (pg)    MCHC 34.3  30.0 - 36.0 (g/dL)    RDW 91.4  78.2 - 95.6 (%)    Platelets 234  150 - 400 (K/uL)   COMPREHENSIVE METABOLIC PANEL     Status: Abnormal   Collection Time   10/01/11  6:57 PM      Component Value Range Comment   Sodium  137  135 - 145 (mEq/L)    Potassium 3.6  3.5 - 5.1 (mEq/L)    Chloride 105  96 - 112 (mEq/L)    CO2 21  19 - 32 (mEq/L)    Glucose, Bld 76  70 - 99 (mg/dL)    BUN 8  6 - 23 (mg/dL)    Creatinine, Ser 2.13  0.50 - 1.10 (mg/dL)    Calcium 8.8  8.4 - 10.5 (mg/dL)    Total Protein 6.2  6.0 - 8.3 (g/dL)    Albumin 3.0 (*) 3.5 - 5.2 (g/dL)    AST 13  0 - 37 (U/L)    ALT 12  0 - 35 (U/L)    Alkaline Phosphatase 53  39 - 117 (U/L)    Total Bilirubin 0.2 (*) 0.3 - 1.2 (mg/dL)    GFR calc non Af Amer >90  >90 (mL/min)    GFR calc Af Amer >90  >90 (mL/min)   URINALYSIS, ROUTINE W  REFLEX MICROSCOPIC     Status: Abnormal   Collection Time   10/01/11  8:21 PM      Component Value Range Comment   Color, Urine YELLOW  YELLOW     APPearance CLEAR  CLEAR     Specific Gravity, Urine 1.020  1.005 - 1.030     pH 7.0  5.0 - 8.0     Glucose, UA NEGATIVE  NEGATIVE (mg/dL)    Hgb urine dipstick NEGATIVE  NEGATIVE     Bilirubin Urine NEGATIVE  NEGATIVE     Ketones, ur >80 (*) NEGATIVE (mg/dL)    Protein, ur NEGATIVE  NEGATIVE (mg/dL)    Urobilinogen, UA 0.2  0.0 - 1.0 (mg/dL)    Nitrite NEGATIVE  NEGATIVE     Leukocytes, UA SMALL (*) NEGATIVE    URINE MICROSCOPIC-ADD ON     Status: Abnormal   Collection Time   10/01/11  8:21 PM      Component Value Range Comment   Squamous Epithelial / LPF MANY (*) RARE     WBC, UA 7-10  <3 (WBC/hpf)    Bacteria, UA RARE  RARE     Urine-Other MUCOUS PRESENT     WET PREP, GENITAL     Status: Abnormal   Collection Time   10/01/11 10:35 PM      Component Value Range Comment   Yeast Wet Prep HPF POC NONE SEEN  NONE SEEN     Trich, Wet Prep NONE SEEN  NONE SEEN     Clue Cells Wet Prep HPF POC MANY (*) NONE SEEN     WBC, Wet Prep HPF POC TOO NUMEROUS TO COUNT (*) NONE SEEN  MANY BACTERIA SEEN   Mr Abdomen Wo Contrast  10/02/2011  *RADIOLOGY REPORT*  Clinical Data: Severe right-sided periumbilical abdominal pain. Clinical suspicion for appendicitis.  MRI ABDOMEN  WITHOUT CONTRAST  Technique:  Multiplanar multisequence MR imaging of the abdomen was performed. No intravenous contrast was administered.  Comparison: None.  Findings: A single intrauterine fetus is seen.  The abdominal parenchymal organs are normal in appearance.  Gallbladder is unremarkable.  There is no evidence of biliary ductal dilatation. No abdominal soft tissue masses or lymphadenopathy identified.  The appendix is mildly enlarged, measuring approximately 8mm in diameter.  There is mild periappendiceal edema. These findings are consistent with early appendicitis.  No evidence of abscess.  No evidence of bowel obstruction.  IMPRESSION:  1.  Findings consistent with early appendicitis. 2.  No evidence of abscess. 3.  Single intrauterine fetus.  These results were called by telephone on 10/02/2011  at  0150 hours to  Dr. Despina Hidden, who verbally acknowledged these results.  Original Report Authenticated By: Danae Orleans, M.D.   US Ob Limited  10/01/2011  OBSTETRICAL ULTRASOUND: This exam was performed within a [place] [facility] Department. The OB US report was generated in the AS system, and faxed to the ordering physician.   This report is also available in [employer] and in the [employer]. See AS Obstetric US report.    Review of Systems  Eyes: Negative.   Gastrointestinal: Positive for nausea, vomiting and abdominal pain.  Neurological: Negative.   Endo/Heme/Allergies: Negative.     Blood pressure 105/62, pulse 69, temperature 97.2 F (36.2 C), temperature source Oral, resp. rate 18, last menstrual period 05/21/2011. Physical Exam  WDWN in NAD HEENT:  EOMI, sclera anicteric Neck:  No masses, no thyromegaly Lungs:  CTA bilaterally; normal respiratory effort CV:  Regular rate and rhythm; no murmurs Abd:  +bowel sounds, gravid uterus, very tender in RLQ Ext:  Well-perfused; no edema Skin:  Warm, dry; no sign of jaundice  Assessment/Plan Acute  appendicitis  Laparoscopic appendectomy.  The surgical procedure has been discussed with the patient.  Potential risks, benefits, alternative treatments, and expected outcomes have been explained.  All of the patient's questions at this time have been answered.  The likelihood of reaching the patient's treatment goal is good.  The patient understand the proposed surgical procedure and wishes to proceed. Obviously the patient is very concerned about her pregnancy, but I think that the risk of not removing the inflamed appendix is higher than the risk of surgery.  I have discussed this with Dr. Despina Hidden, OB/GYN, and he agrees with proceeding with surgery.  Gordon Vandunk K. 10/02/2011, 3:03 AM

## 2011-10-02 NOTE — Preoperative (Signed)
Beta Blockers   Reason not to administer Beta Blockers:Not Applicable 

## 2011-10-02 NOTE — Anesthesia Postprocedure Evaluation (Signed)
  Anesthesia Post-op Note  Patient: Tanya Erickson  Procedure(s) Performed: Procedure(s) (LRB): APPENDECTOMY LAPAROSCOPIC (N/A)  Patient Location: PACU  Anesthesia Type: General  Level of Consciousness: awake, alert  and oriented  Airway and Oxygen Therapy: Patient Spontanous Breathing and Patient connected to nasal cannula oxygen  Post-op Pain: none  Post-op Assessment: Post-op Vital signs reviewed, Patient's Cardiovascular Status Stable, Respiratory Function Stable, Patent Airway, No signs of Nausea or vomiting and Pain level controlled  Post-op Vital Signs: Reviewed  Complications: No apparent anesthesia complications

## 2011-10-02 NOTE — Anesthesia Preprocedure Evaluation (Signed)
Anesthesia Evaluation  Patient identified by MRN, date of birth, ID band Patient awake    Reviewed: Allergy & Precautions, H&P , NPO status , Patient's Chart, lab work & pertinent test results  Airway Mallampati: I TM Distance: >3 FB Neck ROM: Full    Dental  (+) Teeth Intact and Dental Advisory Given   Pulmonary  breath sounds clear to auscultation        Cardiovascular Rhythm:Regular     Neuro/Psych    GI/Hepatic   Endo/Other    Renal/GU      Musculoskeletal   Abdominal   Peds  Hematology  (+) REFUSES BLOOD PRODUCTS, JEHOVAH'S WITNESS (Pt has signed consent for albumin only to be given.)  Anesthesia Other Findings   Reproductive/Obstetrics (+) Pregnancy (~17 weeks )                           Anesthesia Physical Anesthesia Plan  ASA: II  Anesthesia Plan: General   Post-op Pain Management:    Induction: Intravenous and Rapid sequence  Airway Management Planned: Oral ETT  Additional Equipment:   Intra-op Plan:   Post-operative Plan: Extubation in OR  Informed Consent: I have reviewed the patients History and Physical, chart, labs and discussed the procedure including the risks, benefits and alternatives for the proposed anesthesia with the patient or authorized representative who has indicated his/her understanding and acceptance.   Dental advisory given  Plan Discussed with: CRNA, Anesthesiologist and Surgeon  Anesthesia Plan Comments:         Anesthesia Quick Evaluation

## 2011-10-03 ENCOUNTER — Inpatient Hospital Stay (HOSPITAL_COMMUNITY): Admit: 2011-10-03 | Payer: Self-pay

## 2011-10-03 MED ORDER — OXYCODONE-ACETAMINOPHEN 5-325 MG PO TABS: 1.0000 | ORAL_TABLET | ORAL | Status: AC | PRN

## 2011-10-03 MED FILL — Ondansetron HCl Inj 4 MG/2ML (2 MG/ML): INTRAMUSCULAR | Qty: 2 | Status: AC

## 2011-10-03 NOTE — Progress Notes (Signed)
1 Day Post-Op  Subjective: Tolerating diet.  Walking.  Objective: Vital signs in last 24 hours: Temp:  [97.8 F (36.6 C)-98.3 F (36.8 C)] 98 F (36.7 C) (05/27 0550) Pulse Rate:  [64-81] 64  (05/27 0550) Resp:  [16-18] 16  (05/27 0550) BP: (94-103)/(50-64) 97/54 mmHg (05/27 0550) SpO2:  [96 %-100 %] 99 % (05/27 0550)    Intake/Output from previous day: 05/26 0701 - 05/27 0700 In: 2037.5 [P.O.:840; I.V.:1197.5] Out: 2050 [Urine:2050] Intake/Output this shift:    PE: Abd-soft, dressings dry  Lab Results:   [facility] 10/01/11 1857  WBC 9.1  HGB 11.5*  HCT 33.5*  PLT 234   BMET  Basename 10/01/11 1857  NA 137  K 3.6  CL 105  CO2 21  GLUCOSE 76  BUN 8  CREATININE 0.67  CALCIUM 8.8   PT/INR No results found for this basename: LABPROT:2,INR:2 in the last 72 hours Comprehensive Metabolic Panel:    Component Value Date/Time   NA 137 10/01/2011 1857   K 3.6 10/01/2011 1857   CL 105 10/01/2011 1857   CO2 21 10/01/2011 1857   BUN 8 10/01/2011 1857   CREATININE 0.67 10/01/2011 1857   GLUCOSE 76 10/01/2011 1857   CALCIUM 8.8 10/01/2011 1857   AST 13 10/01/2011 1857   ALT 12 10/01/2011 1857   ALKPHOS 53 10/01/2011 1857   BILITOT 0.2* 10/01/2011 1857   PROT 6.2 10/01/2011 1857   ALBUMIN 3.0* 10/01/2011 1857     Studies/Results: Mr Abdomen Wo Contrast  10/02/2011  *RADIOLOGY REPORT*  Clinical Data: Severe right-sided periumbilical abdominal pain. Clinical suspicion for appendicitis.  MRI ABDOMEN WITHOUT CONTRAST  Technique:  Multiplanar multisequence MR imaging of the abdomen was performed. No intravenous contrast was administered.  Comparison: None.  Findings: A single intrauterine fetus is seen.  The abdominal parenchymal organs are normal in appearance.  Gallbladder is unremarkable.  There is no evidence of biliary ductal dilatation. No abdominal soft tissue masses or lymphadenopathy identified.  The appendix is mildly enlarged, measuring approximately 8mm in diameter.   There is mild periappendiceal edema. These findings are consistent with early appendicitis.  No evidence of abscess.  No evidence of bowel obstruction.  IMPRESSION:  1.  Findings consistent with early appendicitis. 2.  No evidence of abscess. 3.  Single intrauterine fetus.  These results were called by telephone on 10/02/2011  at  0150 hours to  Dr. Despina Hidden, who verbally acknowledged these results.  Original Report Authenticated By: Danae Orleans, M.D.   US Ob Limited  10/01/2011  OBSTETRICAL ULTRASOUND: This exam was performed within a [facility]. The OB US report was generated in the [facility] system, and faxed to the ordering physician.   This report is also available in [employer] and in the [employer]. See [facility] Obstetric US report.    Anti-infectives: Anti-infectives     Start     Dose/Rate Route Frequency Ordered Stop   10/02/11 1000   Ampicillin-Sulbactam (UNASYN) 3 g in sodium chloride 0.9 % 100 mL IVPB        3 g 100 mL/hr over 60 Minutes Intravenous Every 6 hours 10/02/11 0637 10/02/11 1102   10/02/11 0645   Ampicillin-Sulbactam (UNASYN) 3 g in sodium chloride 0.9 % 100 mL IVPB  Status:  Discontinued        3 g 100 mL/hr over 60 Minutes Intravenous  Once 10/02/11 0637 10/02/11 0648   10/02/11 0318   Ampicillin-Sulbactam (UNASYN) 3 g in sodium chloride  0.9 % 100 mL IVPB        3 g 100 mL/hr over 60 Minutes Intravenous  Once 10/02/11 1610 10/02/11 0429          Assessment Active Problems:  Acute appendicitis s/p laparoscopic appendectomy 10/02/11-progressing satisfactorily  Intrauterine pregnancy    LOS: 2 days   Plan: Discharge.  Instructions given.   Adolph Pollack 10/03/2011

## 2011-10-03 NOTE — Discharge Instructions (Signed)
[facility] ______[facility] LAPAROSCOPIC SURGERY: POST OP INSTRUCTIONS Always review your discharge instruction sheet given to you by the facility where your surgery was performed. IF YOU HAVE DISABILITY OR FAMILY LEAVE FORMS, YOU MUST BRING THEM TO THE OFFICE FOR PROCESSING.   DO NOT GIVE THEM TO YOUR DOCTOR.  1. A prescription for pain medication may be given to you upon discharge.  Take your pain medication as prescribed, if needed.  If narcotic pain medicine is not needed, then you may take acetaminophen (Tylenol) or ibuprofen (Advil) as needed. 2. Take your usually prescribed medications unless otherwise directed. 3. If you need a refill on your pain medication, please contact your pharmacy.  They will contact our office to request authorization. Prescriptions will not be filled after 5pm or on week-ends. 4. You should follow a light diet the first few days after arrival home, such as soup and crackers, etc.  Be sure to include lots of fluids daily. 5. Most patients will experience some swelling and bruising in the area of the incisions.  Ice packs will help.  Swelling and bruising can take several days to resolve.  6. It is common to experience some constipation if taking pain medication after surgery.  Increasing fluid intake and taking a stool softener (such as Colace) will usually help or prevent this problem from occurring.  A mild laxative (Milk of Magnesia or Miralax) should be taken according to package instructions if there are no bowel movements after 48 hours. 7. Unless discharge instructions indicate otherwise, you may remove your bandages tomorrow, and you may shower today.  You may have steri-strips (small skin tapes) in place directly over the incision.  These strips should be left on the skin.  If your surgeon used skin glue on the incision, you may shower in 24 hours.  The glue will flake off over the next 2-3 weeks.  Any sutures or staples will be removed at the office  during your follow-up visit. 8. ACTIVITIES:  You may resume regular (light) daily activities beginning the next day--such as daily self-care, walking, climbing stairs--gradually increasing activities as tolerated.  You may have sexual intercourse when it is comfortable.  Refrain from any heavy lifting or straining-nothing over 10 pounds for 2 weeks. a. You may drive when you are no longer taking prescription pain medication, you can comfortably wear a seatbelt, and you can safely maneuver your car and apply brakes. b. RETURN TO WORK:  __________________________________________________________ 9. You should see your doctor in the office for a follow-up appointment approximately 2-3 weeks after your surgery.  Make sure that you call for this appointment within a day or two after you arrive home to insure a convenient appointment time. 10. OTHER INSTRUCTIONS: _____________________________Call your OB doctor and make an appointment to have a checkup regarding your pregnancy._____________________________________________________________________________________________ __________________________________________________________________________________________________________________________ WHEN TO CALL YOUR DOCTOR: 1. Fever over 101.5 2. Inability to urinate 3. Continued bleeding from incision. 4. Increased pain, redness, or drainage from the incision. 5. Increasing abdominal pain  The clinic staff is available to answer your questions during regular business hours.  Please don't hesitate to call and ask to speak to one of the nurses for clinical concerns.  If you have a medical emergency, go to the nearest emergency room or call 911.  A surgeon from [facility] is always on call at the hospital. [address], [place], [place]  [address] ? [address] [address], [place], [place]   [address] [phone] ?  [phone] ? FAX [phone] Web site: [email or web address]

## 2011-10-03 NOTE — Progress Notes (Signed)
Pt given dc instructions. Rn went over medications and incision/wound care. Pt verbalized understanding and had no questions regarding care of instructions.  

## 2011-10-04 ENCOUNTER — Encounter (HOSPITAL_COMMUNITY): Payer: Self-pay | Admitting: Surgery

## 2011-10-19 ENCOUNTER — Ambulatory Visit (HOSPITAL_COMMUNITY): Payer: Medicaid Other

## 2011-10-20 ENCOUNTER — Ambulatory Visit (HOSPITAL_COMMUNITY)
Admission: RE | Admit: 2011-10-20 | Discharge: 2011-10-20 | Disposition: A | Discharge status: Home / Self Care | Payer: Medicaid Other | Source: Ambulatory Visit | Attending: Physician Assistant | Admitting: Physician Assistant

## 2011-10-20 DIAGNOSIS — Z1389 Encounter for screening for other disorder: Secondary | ICD-10-CM

## 2011-10-20 DIAGNOSIS — O358XX0 Maternal care for other (suspected) fetal abnormality and damage, not applicable or unspecified: Secondary | ICD-10-CM | POA: Insufficient documentation

## 2011-10-20 DIAGNOSIS — Z3689 Encounter for other specified antenatal screening: Secondary | ICD-10-CM | POA: Insufficient documentation

## 2011-10-27 ENCOUNTER — Ambulatory Visit (INDEPENDENT_AMBULATORY_CARE_PROVIDER_SITE_OTHER): Payer: Medicaid Other | Admitting: Surgery

## 2011-10-27 ENCOUNTER — Encounter (INDEPENDENT_AMBULATORY_CARE_PROVIDER_SITE_OTHER): Payer: Self-pay | Admitting: Surgery

## 2011-10-27 VITALS — BP 108/70 | HR 70 | Temp 97.0°F | Resp 16 | Ht 62.0 in | Wt 168.2 lb

## 2011-10-27 DIAGNOSIS — K3580 Unspecified acute appendicitis: Secondary | ICD-10-CM

## 2011-10-27 NOTE — Progress Notes (Signed)
Status post laparoscopic appendectomy on 10/02/11 for acute appendicitis. The patient is now [redacted] weeks pregnant. She had a recent follow up with her obstetrician that confirmed that the pregnancy is progressing well. Her abdominal pain is resolved. Her incisions are all well healed with no sign of infection. No sign of hernia at the umbilical port site.  Imp:  S/p laparoscopic appendectomy  Doing well  Follow-up PRN   [name]. Corliss Skains, MD, [facility]  10/27/2011 10:51 AM

## 2012-01-15 ENCOUNTER — Inpatient Hospital Stay (HOSPITAL_COMMUNITY)
Admission: AD | Admit: 2012-01-15 | Discharge: 2012-01-15 | Disposition: A | Discharge status: Home / Self Care | Payer: Self-pay | Source: Ambulatory Visit | Attending: Obstetrics and Gynecology | Admitting: Obstetrics and Gynecology

## 2012-01-15 DIAGNOSIS — K649 Unspecified hemorrhoids: Secondary | ICD-10-CM

## 2012-01-15 DIAGNOSIS — O2243 Hemorrhoids in pregnancy, third trimester: Secondary | ICD-10-CM

## 2012-01-15 DIAGNOSIS — O228X9 Other venous complications in pregnancy, unspecified trimester: Secondary | ICD-10-CM | POA: Insufficient documentation

## 2012-01-15 DIAGNOSIS — K644 Residual hemorrhoidal skin tags: Secondary | ICD-10-CM | POA: Insufficient documentation

## 2012-01-15 MED ORDER — HYDROCORTISONE ACE-PRAMOXINE 1-1 % RE FOAM
1.0000 | Freq: Once | RECTAL | Status: AC
  Administered 2012-01-15: 1 via RECTAL
  Filled 2012-01-15: qty 10

## 2012-01-15 NOTE — MAU Note (Signed)
Rectal pain that started today. Thinks she has a hemorrhoid. Denies contractions/vaginal bleeding/rectal bleeding/leaking of fluid. States had some diarrhea last week, but no constipation. Positive fetal movement.

## 2012-01-15 NOTE — MAU Provider Note (Signed)
  History   Tanya Erickson is a 21 y.o. female at [redacted]w[redacted]d who presents with report of noticing bump around rectal area today, w/ burning and irritation.  Reports good fm.  Denies LOF, VB, uc's, current constipation or diarrhea- had some diarrhea last week.  No bleeding from bump.  PNC at [facility], last visit on 9/6, next visit 9/19. S/P appendectomy 2 months ago.   CSN: [id number]  Arrival date and time: 01/15/12 0206   First Provider Initiated Contact with Patient 01/15/12 0224      Chief Complaint  Patient presents with  . Rectal Pain   HPI  OB History    Grav Para Term Preterm Abortions TAB SAB Ect Mult Living   2 1 1       1       Past Medical History  Diagnosis Date  . Abnormal Pap smear     Past Surgical History  Procedure Date  . Laparoscopic appendectomy 10/02/2011    Procedure: APPENDECTOMY LAPAROSCOPIC;  Surgeon: [name]. Corliss Skains, MD;  Location: [facility] OR;  Service: General;  Laterality: N/A;    Family History  Problem Relation Age of Onset  . Cancer Sister     Colon & ovarian cancer  . Heart disease Paternal Uncle   . Diabetes Maternal Grandmother   . Diabetes Maternal Grandfather   . Heart disease Paternal Grandfather     History  Substance Use Topics  . Smoking status: Never Smoker   . Smokeless tobacco: Not on file  . Alcohol Use: No    Allergies: No Known Allergies  No prescriptions prior to admission    Review of Systems  Constitutional: Negative.   HENT: Negative.   Eyes: Negative.   Respiratory: Negative.   Cardiovascular: Negative.   Gastrointestinal: Negative for constipation. Diarrhea: last week, none since.       Noticed 'bump around rectum' today- burning and irritated   Genitourinary: Negative.   Musculoskeletal: Negative.   Skin: Negative.   Neurological: Negative.   Endo/Heme/Allergies: Negative.   Psychiatric/Behavioral: Negative.    Physical Exam   Blood pressure 107/71, pulse 87, temperature 97 F (36.1 C), temperature  source Oral, resp. rate 18, height 5\' 4"  (1.626 m), weight 85.186 kg (187 lb 12.8 oz), last menstrual period 05/21/2011.  Physical Exam  Constitutional: She appears well-developed and well-nourished.  HENT:  Head: Normocephalic.  Skin: Skin is warm and dry.  Psychiatric: She has a normal mood and affect. Her behavior is normal. Judgment and thought content normal.  External hemorrhoid, not thrombosed, but tender to touch  FHR: 140, mod, 15x15accels, no decels=Cat I UCs: none MAU Course  Procedures  EFM Physical exam Proctofoam  Assessment and Plan  A:  [redacted]w[redacted]d SIUP  External hemorrhoid   P:  D/C home  Proctofoam-HC bid while symptoms persist  Warm baths/ice packs prn  Keep appt 8/19 at [facility] as scheduled  Marge Duncans 01/15/2012, 2:26 AM

## 2012-01-16 NOTE — MAU Provider Note (Signed)
Agree with above note.  Tanya Erickson 01/16/2012 1:22 PM   

## 2012-02-15 ENCOUNTER — Encounter (HOSPITAL_COMMUNITY): Payer: Self-pay | Admitting: *Deleted

## 2012-02-15 ENCOUNTER — Inpatient Hospital Stay (HOSPITAL_COMMUNITY): Payer: Medicaid Other

## 2012-02-15 ENCOUNTER — Inpatient Hospital Stay (HOSPITAL_COMMUNITY)
Admission: AD | Admit: 2012-02-15 | Discharge: 2012-02-15 | Disposition: A | Discharge status: Home / Self Care | Payer: Medicaid Other | Source: Ambulatory Visit | Attending: Obstetrics & Gynecology | Admitting: Obstetrics & Gynecology

## 2012-02-15 DIAGNOSIS — R109 Unspecified abdominal pain: Secondary | ICD-10-CM | POA: Insufficient documentation

## 2012-02-15 DIAGNOSIS — M545 Low back pain, unspecified: Secondary | ICD-10-CM | POA: Insufficient documentation

## 2012-02-15 DIAGNOSIS — M7918 Myalgia, other site: Secondary | ICD-10-CM

## 2012-02-15 LAB — URINALYSIS, ROUTINE W REFLEX MICROSCOPIC
Ketones, ur: NEGATIVE mg/dL
Nitrite: NEGATIVE
Protein, ur: NEGATIVE mg/dL

## 2012-02-15 LAB — CBC WITH DIFFERENTIAL/PLATELET
Basophils Absolute: 0 10*3/uL (ref 0.0–0.1)
Basophils Relative: 0 % (ref 0–1)
MCHC: 32.5 g/dL (ref 30.0–36.0)
Monocytes Absolute: 0.6 10*3/uL (ref 0.1–1.0)
Neutro Abs: 4 10*3/uL (ref 1.7–7.7)
Neutrophils Relative %: 62 % (ref 43–77)
Platelets: 252 10*3/uL (ref 150–400)
RDW: 14 % (ref 11.5–15.5)

## 2012-02-15 LAB — COMPREHENSIVE METABOLIC PANEL
AST: 9 U/L (ref 0–37)
Albumin: 2.6 g/dL — ABNORMAL LOW (ref 3.5–5.2)
Chloride: 105 mEq/L (ref 96–112)
Creatinine, Ser: 0.4 mg/dL — ABNORMAL LOW (ref 0.50–1.10)
Potassium: 3.3 mEq/L — ABNORMAL LOW (ref 3.5–5.1)
Sodium: 137 mEq/L (ref 135–145)
Total Bilirubin: 0.2 mg/dL — ABNORMAL LOW (ref 0.3–1.2)

## 2012-02-15 LAB — URINE MICROSCOPIC-ADD ON

## 2012-02-15 MED ORDER — ACETAMINOPHEN 325 MG PO TABS
650.0000 mg | ORAL_TABLET | Freq: Four times a day (QID) | ORAL | Status: DC | PRN
  Administered 2012-02-15: 650 mg via ORAL
  Filled 2012-02-15: qty 2

## 2012-02-15 MED ORDER — OXYCODONE-ACETAMINOPHEN 5-325 MG PO TABS
1.0000 | ORAL_TABLET | ORAL | Status: DC | PRN
  Administered 2012-02-15: 1 via ORAL
  Filled 2012-02-15: qty 1

## 2012-02-15 MED ORDER — ONDANSETRON 8 MG PO TBDP
8.0000 mg | ORAL_TABLET | ORAL | Status: AC
  Administered 2012-02-15: 8 mg via ORAL
  Filled 2012-02-15: qty 1

## 2012-02-15 MED ORDER — GI COCKTAIL ~~LOC~~
30.0000 mL | Freq: Once | ORAL | Status: DC
  Filled 2012-02-15: qty 30

## 2012-02-15 MED ORDER — OXYCODONE HCL 5 MG PO TABS: 5.0000 mg | ORAL_TABLET | ORAL | Status: DC | PRN

## 2012-02-15 NOTE — MAU Note (Signed)
KShaw, CNM notified pt back from u/s, feeling nauseated, pain unchanged post percocet at 0707, order for zofran given. Will come see pt promptly.

## 2012-02-15 NOTE — MAU Note (Signed)
Discussed with [place] p.o. Intake.  Pt was given oral meds since 0700 and had sips of gingerale.

## 2012-02-15 NOTE — MAU Provider Note (Signed)
History     CSN: [id number]  Arrival date and time: 02/15/12 0530   None   Tanya Erickson 21 y.o. G2P1001 [redacted]w[redacted]d   Chief Complaint  Patient presents with  . Abdominal Pain   HPI Patient complains of lower back pain that started last night. Then around 2-3am "really really sharp pain that would not go away." She describes the pain as constant, and reports she could not breath it hurt so bad. Denies leaking or gush of fluid. No bleeding. Felt her baby move around 4:30 am. Denies fevers, nausea, vomit, diarrhea or constipation. Currently pain feels the same and 7-8/10 in right mid abdomen.    Past Medical History  Diagnosis Date  . Abnormal Pap smear     Past Surgical History  Procedure Date  . Laparoscopic appendectomy 10/02/2011    Procedure: APPENDECTOMY LAPAROSCOPIC;  Surgeon: [name]. Corliss Skains, MD;  Location: [facility] OR;  Service: General;  Laterality: N/A;    Family History  Problem Relation Age of Onset  . Cancer Sister     Colon & ovarian cancer  . Heart disease Paternal Uncle   . Diabetes Maternal Grandmother   . Diabetes Maternal Grandfather   . Heart disease Paternal Grandfather     History  Substance Use Topics  . Smoking status: Never Smoker   . Smokeless tobacco: Not on file  . Alcohol Use: No    Allergies: No Known Allergies  No prescriptions prior to admission    Review of Systems  Constitutional: Negative for fever and chills.  Eyes: Negative for blurred vision, double vision and photophobia.  Respiratory: Negative for cough, shortness of breath and wheezing.   Cardiovascular: Negative for chest pain and palpitations.  Gastrointestinal: Positive for abdominal pain. Negative for nausea, vomiting, diarrhea and constipation.  Genitourinary: Negative for dysuria and urgency.  Musculoskeletal: Positive for back pain.  Skin: Negative for rash.  Neurological: Negative for dizziness, weakness and headaches.    Physical Exam   Blood pressure  114/74, pulse 88, temperature 97.4 F (36.3 C), temperature source Oral, resp. rate 18, height 5\' 4"  (1.626 m), weight 85.73 kg (189 lb), last menstrual period 05/21/2011, SpO2 100.00%.  Physical Exam  Constitutional: She is oriented to person, place, and time. She appears well-developed and well-nourished. No distress.  HENT:  Head: Normocephalic and atraumatic.  Eyes: Pupils are equal, round, and reactive to light. Right eye exhibits no discharge. Left eye exhibits no discharge. No scleral icterus.  Neck: Neck supple.  Cardiovascular: Normal rate, regular rhythm and normal heart sounds.   No murmur heard. Respiratory: Effort normal and breath sounds normal. No respiratory distress. She has no wheezes. She has no rales.  GI: Soft. Bowel sounds are normal. There is tenderness.       Sharp tenderness RUQ with palpation  Musculoskeletal: She exhibits no edema and no tenderness.  Neurological: She is alert and oriented to person, place, and time.  Skin: Skin is warm and dry. No rash noted. She is not diaphoretic.  Psychiatric: She has a normal mood and affect.   Results for orders placed during the hospital encounter of 02/15/12 (from the past 48 hour(s))  URINALYSIS, ROUTINE W REFLEX MICROSCOPIC     Status: Abnormal   Collection Time   02/15/12  5:30 AM      Component Value Range Comment   Color, Urine YELLOW  YELLOW    APPearance CLEAR  CLEAR    Specific Gravity, Urine <1.005 (*) 1.005 - 1.030  pH 5.5  5.0 - 8.0    Glucose, UA NEGATIVE  NEGATIVE mg/dL    Hgb urine dipstick NEGATIVE  NEGATIVE    Bilirubin Urine NEGATIVE  NEGATIVE    Ketones, ur NEGATIVE  NEGATIVE mg/dL    Protein, ur NEGATIVE  NEGATIVE mg/dL    Urobilinogen, UA 0.2  0.0 - 1.0 mg/dL    Nitrite NEGATIVE  NEGATIVE    Leukocytes, UA MODERATE (*) NEGATIVE   URINE MICROSCOPIC-ADD ON     Status: Abnormal   Collection Time   02/15/12  5:30 AM      Component Value Range Comment   Squamous Epithelial / LPF MANY (*) RARE      WBC, UA 7-10  <3 WBC/hpf    Bacteria, UA MANY (*) RARE   CBC WITH DIFFERENTIAL     Status: Abnormal   Collection Time   02/15/12  6:50 AM      Component Value Range Comment   WBC 6.5  4.0 - 10.5 K/uL    RBC 3.57 (*) 3.87 - 5.11 MIL/uL    Hemoglobin 9.8 (*) 12.0 - 15.0 g/dL    HCT 96.0 (*) 45.4 - 46.0 %    MCV 84.6  78.0 - 100.0 fL    MCH 27.5  26.0 - 34.0 pg    MCHC 32.5  30.0 - 36.0 g/dL    RDW 09.8  11.9 - 14.7 %    Platelets 252  150 - 400 K/uL    Neutrophils Relative 62  43 - 77 %    Neutro Abs 4.0  1.7 - 7.7 K/uL    Lymphocytes Relative 28  12 - 46 %    Lymphs Abs 1.8  0.7 - 4.0 K/uL    Monocytes Relative 9  3 - 12 %    Monocytes Absolute 0.6  0.1 - 1.0 K/uL    Eosinophils Relative 1  0 - 5 %    Eosinophils Absolute 0.1  0.0 - 0.7 K/uL    Basophils Relative 0  0 - 1 %    Basophils Absolute 0.0  0.0 - 0.1 K/uL   COMPREHENSIVE METABOLIC PANEL     Status: Abnormal   Collection Time   02/15/12  6:50 AM      Component Value Range Comment   Sodium 137  135 - 145 mEq/L    Potassium 3.3 (*) 3.5 - 5.1 mEq/L    Chloride 105  96 - 112 mEq/L    CO2 19  19 - 32 mEq/L    Glucose, Bld 83  70 - 99 mg/dL    BUN 3 (*) 6 - 23 mg/dL    Creatinine, Ser 8.29 (*) 0.50 - 1.10 mg/dL    Calcium 8.7  8.4 - 56.2 mg/dL    Total Protein 5.8 (*) 6.0 - 8.3 g/dL    Albumin 2.6 (*) 3.5 - 5.2 g/dL    AST 9  0 - 37 U/L    ALT 6  0 - 35 U/L    Alkaline Phosphatase 118 (*) 39 - 117 U/L    Total Bilirubin 0.2 (*) 0.3 - 1.2 mg/dL    GFR calc non Af Amer >90  >90 mL/min    GFR calc Af Amer >90  >90 mL/min   LACTATE DEHYDROGENASE     Status: Normal   Collection Time   02/15/12  6:50 AM      Component Value Range Comment   LDH 126  94 - 250 U/L  RUQ Ultrasound: 1. Suspect mild fatty infiltration of the liver.  2. Otherwise negative right upper quadrant ultrasound. OB ultrasound: Single living IUP with assigned gestation [redacted]w[redacted]d in cephalic position AFI: 7.2 with 4th percentile Placenta anterior with  no evidence of abruption or previa MAU Course  Procedures Ultrasound OB and GB CBC, CMP, UA and LDH labs   Assessment and Plan  1. Abdominal Pain 2. Discharged home by day shift   Felix Pacini 02/15/2012, 6:18 AM   I supervised this resident for the evaluation of this pt; the testing & imaging results were reviewed and the plan of care determined by the following shift. Cam Hai 9:12 AM 02/17/2012

## 2012-02-15 NOTE — MAU Note (Signed)
Refused GI cocktail, has had before, states does not feel like GI  Or stomach problem

## 2012-02-15 NOTE — MAU Note (Signed)
Pt feeling nauseated, wet wash cloth given, emesis bag. Pt states no food eaten since last night, took medication for pain, now feels sick. Rates pain 8/10.

## 2012-02-15 NOTE — MAU Note (Signed)
Pt reports pain in rt upper abd, sharp pain x 24 hours. Denies nausea, vomiting, diarrhea, fever. Pt reports she had her appendix out in August

## 2012-02-26 ENCOUNTER — Inpatient Hospital Stay (HOSPITAL_COMMUNITY)
Admission: AD | Admit: 2012-02-26 | Discharge: 2012-02-26 | Disposition: A | Discharge status: Home / Self Care | Payer: Medicaid Other | Source: Ambulatory Visit | Attending: Obstetrics & Gynecology | Admitting: Obstetrics & Gynecology

## 2012-02-26 ENCOUNTER — Encounter (HOSPITAL_COMMUNITY): Payer: Self-pay

## 2012-02-26 DIAGNOSIS — O2390 Unspecified genitourinary tract infection in pregnancy, unspecified trimester: Secondary | ICD-10-CM | POA: Insufficient documentation

## 2012-02-26 DIAGNOSIS — N390 Urinary tract infection, site not specified: Secondary | ICD-10-CM

## 2012-02-26 DIAGNOSIS — O99891 Other specified diseases and conditions complicating pregnancy: Secondary | ICD-10-CM | POA: Insufficient documentation

## 2012-02-26 DIAGNOSIS — N760 Acute vaginitis: Secondary | ICD-10-CM | POA: Insufficient documentation

## 2012-02-26 DIAGNOSIS — A499 Bacterial infection, unspecified: Secondary | ICD-10-CM

## 2012-02-26 DIAGNOSIS — B9689 Other specified bacterial agents as the cause of diseases classified elsewhere: Secondary | ICD-10-CM | POA: Insufficient documentation

## 2012-02-26 HISTORY — DX: Anemia, unspecified: D64.9

## 2012-02-26 LAB — URINE MICROSCOPIC-ADD ON

## 2012-02-26 LAB — WET PREP, GENITAL

## 2012-02-26 LAB — URINALYSIS, ROUTINE W REFLEX MICROSCOPIC
Nitrite: NEGATIVE
Specific Gravity, Urine: 1.01 (ref 1.005–1.030)
Urobilinogen, UA: 0.2 mg/dL (ref 0.0–1.0)

## 2012-02-26 LAB — OB RESULTS CONSOLE ABO/RH

## 2012-02-26 LAB — POCT FERN TEST: Fern Test: NEGATIVE

## 2012-02-26 LAB — OB RESULTS CONSOLE GBS: GBS: NEGATIVE

## 2012-02-26 LAB — OB RESULTS CONSOLE RPR: RPR: NONREACTIVE

## 2012-02-26 MED ORDER — NITROFURANTOIN MONOHYD MACRO 100 MG PO CAPS: 100.0000 mg | ORAL_CAPSULE | Freq: Two times a day (BID) | ORAL | Status: AC

## 2012-02-26 MED ORDER — ZOLPIDEM TARTRATE 5 MG PO TABS
5.0000 mg | ORAL_TABLET | Freq: Once | ORAL | Status: DC
  Filled 2012-02-26: qty 1

## 2012-02-26 MED ORDER — METRONIDAZOLE 500 MG PO TABS: 500.0000 mg | ORAL_TABLET | Freq: Two times a day (BID) | ORAL | Status: AC

## 2012-02-26 NOTE — MAU Provider Note (Signed)
History     CSN: [id number]  Arrival date and time: 02/26/12 0106   None     Chief Complaint  Patient presents with  . Rupture of Membranes  . Contractions   HPI CIEARRA RUFO is a 21 y.o. G74P1001 female at [redacted]w[redacted]d who presents w/ report of leaking greenish-yellow foul smelling fluid x 3 days.  Denies vaginal pruritus or irritation.  Reports occasional uc's w/ decreased fm x 1 week.  Had frequent uc's, lost mucous plug, and had some vaginal bleeding last Sunday- but none since.  GC/CT neg on 10/14. Last seen at [facility] on Monday, next appt on Wed.   OB History    Grav Para Term Preterm Abortions TAB SAB Ect Mult Living   2 1 1       1       Past Medical History  Diagnosis Date  . Abnormal Pap smear   . Anemia     Past Surgical History  Procedure Date  . Laparoscopic appendectomy 10/02/2011    Procedure: APPENDECTOMY LAPAROSCOPIC;  Surgeon: [name]. Corliss Skains, MD;  Location: [facility] OR;  Service: General;  Laterality: N/A;    Family History  Problem Relation Age of Onset  . Cancer Sister     Colon & ovarian cancer  . Heart disease Paternal Uncle   . Diabetes Maternal Grandmother   . Diabetes Maternal Grandfather   . Heart disease Paternal Grandfather     History  Substance Use Topics  . Smoking status: Never Smoker   . Smokeless tobacco: Not on file  . Alcohol Use: No    Allergies:  Allergies  Allergen Reactions  . Mushroom Extract Complex Anaphylaxis    No prescriptions prior to admission    Review of Systems  Constitutional: Negative.   HENT: Negative.   Eyes: Negative.   Respiratory: Negative.   Cardiovascular: Negative.   Gastrointestinal: Positive for abdominal pain (constant lower abdominal pain since last sun).  Genitourinary: Positive for urgency and frequency. Negative for dysuria, hematuria and flank pain.  Musculoskeletal: Negative.   Skin: Negative.   Neurological: Negative.   Endo/Heme/Allergies: Negative.   Psychiatric/Behavioral:  Negative.    Physical Exam   Blood pressure 122/57, pulse 88, temperature 98.2 F (36.8 C), temperature source Oral, resp. rate 20, height 5\' 4"  (1.626 m), weight 86.456 kg (190 lb 9.6 oz), last menstrual period 05/21/2011.  Physical Exam  Constitutional: She is oriented to person, place, and time. She appears well-developed and well-nourished.  HENT:  Head: Normocephalic.  Neck: Normal range of motion.  Cardiovascular: Normal rate and regular rhythm.   Respiratory: Effort normal and breath sounds normal.  GI: Soft. There is tenderness (suprapubic).       Gravid   Genitourinary: Vagina normal and uterus normal.       SSE: cervix visually closed, no pooling of fluid, mod amount thin white homogenous non-odorous d/c. Wet prep and slide for fern obtained  SVE: outer os 1, unable to reach inner os d/t pt discomfort/thick/-3  Musculoskeletal: Normal range of motion. She exhibits no edema.  Neurological: She is alert and oriented to person, place, and time. She has normal reflexes.  Skin: Skin is warm and dry.  Psychiatric: She has a normal mood and affect. Her behavior is normal. Judgment and thought content normal.   FHR: 130, mod variability, 15x15 accels, no decels= Cat I UCs: mild, irregular  MAU Course  Procedures  EFM SSE w/ wet prep and fern (neg) SVE UA, C&S Ambien 5mg   po x 1 per pt request  Results for orders placed during the hospital encounter of 02/26/12 (from the past 24 hour(s))  OB RESULTS CONSOLE GBS     Status: Normal      Component Value Range   GBS Negative    OB RESULTS CONSOLE RPR     Status: Normal      Component Value Range   RPR Nonreactive    OB RESULTS CONSOLE HIV ANTIBODY (ROUTINE TESTING)     Status: Normal      Component Value Range   HIV Non-reactive    OB RESULTS CONSOLE ABO/RH     Status: Normal      Component Value Range   RH Type  Positive     ABO Grouping O    OB RESULTS CONSOLE ANTIBODY SCREEN     Status: Normal      Component Value  Range   Antibody Screen Negative    WET PREP, GENITAL     Status: Abnormal   Collection Time   02/26/12  2:18 AM      Component Value Range   Yeast Wet Prep HPF POC NONE SEEN  NONE SEEN   Trich, Wet Prep NONE SEEN  NONE SEEN   Clue Cells Wet Prep HPF POC MODERATE (*) NONE SEEN   WBC, Wet Prep HPF POC MODERATE (*) NONE SEEN  URINALYSIS, ROUTINE W REFLEX MICROSCOPIC     Status: Abnormal   Collection Time   02/26/12  2:19 AM      Component Value Range   Color, Urine YELLOW  YELLOW   APPearance CLEAR  CLEAR   Specific Gravity, Urine 1.010  1.005 - 1.030   pH 6.0  5.0 - 8.0   Glucose, UA NEGATIVE  NEGATIVE mg/dL   Hgb urine dipstick TRACE (*) NEGATIVE   Bilirubin Urine NEGATIVE  NEGATIVE   Ketones, ur NEGATIVE  NEGATIVE mg/dL   Protein, ur NEGATIVE  NEGATIVE mg/dL   Urobilinogen, UA 0.2  0.0 - 1.0 mg/dL   Nitrite NEGATIVE  NEGATIVE   Leukocytes, UA MODERATE (*) NEGATIVE  URINE MICROSCOPIC-ADD ON     Status: Abnormal   Collection Time   02/26/12  2:19 AM      Component Value Range   Squamous Epithelial / LPF FEW (*) RARE   WBC, UA 7-10  <3 WBC/hpf   Bacteria, UA FEW (*) RARE   Urine-Other MUCOUS PRESENT    POCT FERN TEST     Status: Normal   Collection Time   02/26/12  2:24 AM      Component Value Range   Fern Test Negative      Zhavia, Mitchell  Home Medication Instructions [id number]   Printed on:02/26/12 0325  Medication Information                    metroNIDAZOLE (FLAGYL) 500 MG tablet Take 1 tablet (500 mg total) by mouth 2 (two) times daily.           nitrofurantoin, macrocrystal-monohydrate, (MACROBID) 100 MG capsule Take 1 capsule (100 mg total) by mouth 2 (two) times daily.            Follow-up Information    Follow up with [facility] [place]. On 02/29/2012. (as scheduled.  Return to hospital  as needed)    Contact information:   [address] [place] [address] [phone]          Assessment and Plan  A:  [redacted]w[redacted]d  SIUP  Cat  I FHR  GBS neg  UTI- will treat based on symptoms  BV  P:  D/C home  To keep appt at [facility] on Wed as scheduled  Return to hospital prn  Rx Macrobid 100mg  bid x 5d  Rx Metronidazole 500mg  bid x 7d    Marge Duncans 02/26/2012, 2:24 AM

## 2012-02-26 NOTE — MAU Note (Signed)
Leaking for 3 days but didn't pay much attention to it. Fld has been green since started leaking. Occ contractions

## 2012-02-27 LAB — URINE CULTURE: Colony Count: NO GROWTH

## 2012-03-09 ENCOUNTER — Inpatient Hospital Stay (HOSPITAL_COMMUNITY)
Admission: AD | Admit: 2012-03-09 | Discharge: 2012-03-10 | DRG: 775 | Disposition: A | Discharge status: Home / Self Care | Payer: Medicaid Other | Source: Ambulatory Visit | Attending: Obstetrics & Gynecology | Admitting: Obstetrics & Gynecology

## 2012-03-09 ENCOUNTER — Encounter (HOSPITAL_COMMUNITY): Payer: Self-pay | Admitting: Family Medicine

## 2012-03-09 DIAGNOSIS — IMO0001 Reserved for inherently not codable concepts without codable children: Secondary | ICD-10-CM

## 2012-03-09 DIAGNOSIS — Z531 Procedure and treatment not carried out because of patient's decision for reasons of belief and group pressure: Secondary | ICD-10-CM

## 2012-03-09 DIAGNOSIS — IMO0002 Reserved for concepts with insufficient information to code with codable children: Secondary | ICD-10-CM | POA: Diagnosis present

## 2012-03-09 DIAGNOSIS — Z3490 Encounter for supervision of normal pregnancy, unspecified, unspecified trimester: Secondary | ICD-10-CM

## 2012-03-09 DIAGNOSIS — O4290 Premature rupture of membranes, unspecified as to length of time between rupture and onset of labor, unspecified weeks of gestation: Secondary | ICD-10-CM

## 2012-03-09 LAB — CBC
HCT: 31 % — ABNORMAL LOW (ref 36.0–46.0)
Hemoglobin: 10.1 g/dL — ABNORMAL LOW (ref 12.0–15.0)
MCH: 27.2 pg (ref 26.0–34.0)
MCHC: 32.6 g/dL (ref 30.0–36.0)
MCV: 83.6 fL (ref 78.0–100.0)
RBC: 3.71 MIL/uL — ABNORMAL LOW (ref 3.87–5.11)

## 2012-03-09 LAB — POCT FERN TEST: Fern Test: POSITIVE

## 2012-03-09 LAB — ABO/RH: ABO/RH(D): O POS

## 2012-03-09 LAB — RPR: RPR Ser Ql: NONREACTIVE

## 2012-03-09 MED ORDER — LIDOCAINE HCL (PF) 1 % IJ SOLN
30.0000 mL | INTRAMUSCULAR | Status: DC | PRN
  Filled 2012-03-09: qty 30

## 2012-03-09 MED ORDER — NALOXONE HCL 0.4 MG/ML IJ SOLN
INTRAMUSCULAR | Status: AC
  Filled 2012-03-09: qty 1

## 2012-03-09 MED ORDER — DIPHENHYDRAMINE HCL 25 MG PO CAPS: 25.0000 mg | ORAL_CAPSULE | Freq: Four times a day (QID) | ORAL | Status: DC | PRN

## 2012-03-09 MED ORDER — OXYCODONE-ACETAMINOPHEN 5-325 MG PO TABS
1.0000 | ORAL_TABLET | ORAL | Status: DC | PRN
  Administered 2012-03-09 (×3): 1 via ORAL
  Filled 2012-03-09 (×3): qty 1

## 2012-03-09 MED ORDER — OXYTOCIN 40 UNITS IN LACTATED RINGERS INFUSION - SIMPLE MED
62.5000 mL/h | INTRAVENOUS | Status: DC
  Administered 2012-03-09: 62.5 mL/h via INTRAVENOUS
  Filled 2012-03-09: qty 1000

## 2012-03-09 MED ORDER — LANOLIN HYDROUS EX OINT: TOPICAL_OINTMENT | CUTANEOUS | Status: DC | PRN

## 2012-03-09 MED ORDER — PRENATAL MULTIVITAMIN CH
1.0000 | ORAL_TABLET | Freq: Every day | ORAL | Status: DC
Start: 2012-03-09 — End: 2012-03-10
  Administered 2012-03-09 – 2012-03-10 (×2): 1 via ORAL
  Filled 2012-03-09 (×2): qty 1

## 2012-03-09 MED ORDER — ONDANSETRON HCL 4 MG/2ML IJ SOLN: 4.0000 mg | INTRAMUSCULAR | Status: DC | PRN

## 2012-03-09 MED ORDER — SENNOSIDES-DOCUSATE SODIUM 8.6-50 MG PO TABS
2.0000 | ORAL_TABLET | Freq: Every day | ORAL | Status: DC
  Administered 2012-03-09: 2 via ORAL

## 2012-03-09 MED ORDER — NALBUPHINE SYRINGE 5 MG/0.5 ML: 5.0000 mg | INJECTION | INTRAMUSCULAR | Status: DC | PRN

## 2012-03-09 MED ORDER — OXYCODONE-ACETAMINOPHEN 5-325 MG PO TABS: 1.0000 | ORAL_TABLET | ORAL | Status: DC | PRN

## 2012-03-09 MED ORDER — ONDANSETRON HCL 4 MG PO TABS: 4.0000 mg | ORAL_TABLET | ORAL | Status: DC | PRN

## 2012-03-09 MED ORDER — FLEET ENEMA 7-19 GM/118ML RE ENEM: 1.0000 | ENEMA | RECTAL | Status: DC | PRN

## 2012-03-09 MED ORDER — SIMETHICONE 80 MG PO CHEW: 80.0000 mg | CHEWABLE_TABLET | ORAL | Status: DC | PRN

## 2012-03-09 MED ORDER — ACETAMINOPHEN 325 MG PO TABS: 650.0000 mg | ORAL_TABLET | ORAL | Status: DC | PRN

## 2012-03-09 MED ORDER — IBUPROFEN 600 MG PO TABS
600.0000 mg | ORAL_TABLET | Freq: Four times a day (QID) | ORAL | Status: DC | PRN
  Administered 2012-03-09: 600 mg via ORAL
  Filled 2012-03-09: qty 1

## 2012-03-09 MED ORDER — FENTANYL CITRATE 0.05 MG/ML IJ SOLN
100.0000 ug | INTRAMUSCULAR | Status: DC | PRN
  Administered 2012-03-09 (×3): 100 ug via INTRAVENOUS
  Filled 2012-03-09 (×3): qty 2

## 2012-03-09 MED ORDER — ZOLPIDEM TARTRATE 5 MG PO TABS: 5.0000 mg | ORAL_TABLET | Freq: Every evening | ORAL | Status: DC | PRN

## 2012-03-09 MED ORDER — BENZOCAINE-MENTHOL 20-0.5 % EX AERO
1.0000 "application " | INHALATION_SPRAY | CUTANEOUS | Status: DC | PRN
  Filled 2012-03-09: qty 56

## 2012-03-09 MED ORDER — WITCH HAZEL-GLYCERIN EX PADS: 1.0000 "application " | MEDICATED_PAD | CUTANEOUS | Status: DC | PRN

## 2012-03-09 MED ORDER — ONDANSETRON HCL 4 MG/2ML IJ SOLN: 4.0000 mg | Freq: Four times a day (QID) | INTRAMUSCULAR | Status: DC | PRN

## 2012-03-09 MED ORDER — IBUPROFEN 600 MG PO TABS
600.0000 mg | ORAL_TABLET | Freq: Four times a day (QID) | ORAL | Status: DC
  Administered 2012-03-09 – 2012-03-10 (×5): 600 mg via ORAL
  Filled 2012-03-09 (×6): qty 1

## 2012-03-09 MED ORDER — LACTATED RINGERS IV SOLN: INTRAVENOUS | Status: DC

## 2012-03-09 MED ORDER — CITRIC ACID-SODIUM CITRATE 334-500 MG/5ML PO SOLN: 30.0000 mL | ORAL | Status: DC | PRN

## 2012-03-09 MED ORDER — LACTATED RINGERS IV SOLN: 500.0000 mL | INTRAVENOUS | Status: DC | PRN

## 2012-03-09 MED ORDER — DIBUCAINE 1 % RE OINT
1.0000 "application " | TOPICAL_OINTMENT | RECTAL | Status: DC | PRN
  Filled 2012-03-09: qty 28

## 2012-03-09 MED ORDER — OXYTOCIN BOLUS FROM INFUSION: 500.0000 mL | Freq: Once | INTRAVENOUS | Status: DC

## 2012-03-09 MED ORDER — TETANUS-DIPHTH-ACELL PERTUSSIS 5-2.5-18.5 LF-MCG/0.5 IM SUSP
0.5000 mL | Freq: Once | INTRAMUSCULAR | Status: DC
Start: 2012-03-10 — End: 2012-03-09

## 2012-03-09 NOTE — Progress Notes (Signed)
NEESA KNAPIK is a 21 y.o. G2P1001 at [redacted]w[redacted]d by ultrasound admitted for rupture of membranes  Subjective: Doing well. Using IV meds for pain relief.  Objective: BP 111/56  Pulse 90  Temp 97.8 F (36.6 C) (Oral)  Resp 20  Ht 5\' 4"  (1.626 m)  Wt 191 lb (86.637 kg)  BMI 32.79 kg/m2  LMP 05/21/2011      FHT:  FHR: 140 bpm, variability: moderate,  accelerations:  Present,  decelerations:  Absent UC:   regular, every 2-3 minutes SVE:   Dilation: 6.5 Effacement (%): 100 Station: -1 Exam by:: T.Lessard RN  Labs: Lab Results  Component Value Date   WBC 7.7 03/09/2012   HGB 10.1* 03/09/2012   HCT 31.0* 03/09/2012   MCV 83.6 03/09/2012   PLT 252 03/09/2012    Assessment / Plan: Spontaneous labor, progressing normally  Labor: Progressing normally Preeclampsia:   Fetal Wellbeing:  Category I Pain Control:  Fentanyl I/D:  n/a Anticipated MOD:  NSVD  Lasharon Dunivan 03/09/2012, 6:48 AM

## 2012-03-09 NOTE — MAU Note (Signed)
Pt states she had a gush of fluid at 0100

## 2012-03-09 NOTE — Progress Notes (Signed)
CNM notified of pt status, FHR, UC pattern, and SVE. Will continue to monitor.

## 2012-03-09 NOTE — H&P (Addendum)
Tanya Erickson is a 21 y.o. female presenting for SROM and onset of labor.  Maternal Medical History:  Reason for admission: Reason for admission: rupture of membranes.  Contractions: Onset was 1-2 hours ago.   Frequency: regular.   Duration is approximately 5 minutes.   Perceived severity is mild.    Fetal activity: Perceived fetal activity is normal.   Last perceived fetal movement was within the past hour.    Prenatal complications: no prenatal complications Prenatal Complications - Diabetes: none.    OB History    Grav Para Term Preterm Abortions TAB SAB Ect Mult Living   2 1 1       1      Past Medical History  Diagnosis Date  . Abnormal Pap smear   . Anemia    Past Surgical History  Procedure Date  . Laparoscopic appendectomy 10/02/2011    Procedure: APPENDECTOMY LAPAROSCOPIC;  Surgeon: [name]. Corliss Skains, MD;  Location: [facility] OR;  Service: General;  Laterality: N/A;   Family History: family history includes Cancer in her sister; Diabetes in her maternal grandfather and maternal grandmother; and Heart disease in her paternal grandfather and paternal uncle. Social History:  reports that she has never smoked. She does not have any smokeless tobacco history on file. She reports that she does not drink alcohol or use illicit drugs.   Prenatal Transfer Tool  Maternal Diabetes: No Genetic Screening: Normal Maternal Ultrasounds/Referrals: Normal Fetal Ultrasounds or other Referrals:  None Maternal Substance Abuse:  No Significant Maternal Medications:  None Significant Maternal Lab Results:  None Other Comments:  None  Review of Systems  Constitutional: Negative for fever and chills.  Eyes: Negative for blurred vision.  Respiratory: Negative for cough and wheezing.   Cardiovascular: Negative for chest pain and palpitations.  Gastrointestinal: Negative for heartburn.  Skin: Negative.   Neurological: Negative for dizziness, tingling and headaches.   Dilation:  2 Effacement (%): 90 Station: -3 Exam by:: M.Topp,RN Blood pressure 116/71, pulse 86, temperature 97.2 F (36.2 C), temperature source Oral, resp. rate 18, height 5\' 4"  (1.626 m), weight 86.637 kg (191 lb), last menstrual period 05/21/2011.  Maternal Exam:  Abdomen: Patient reports no abdominal tenderness. Surgical scars: laparoscopic scar.   Introitus: Ferning test: positive.      Physical Exam  Constitutional: She is oriented to person, place, and time. She appears well-developed and well-nourished.  HENT:  Head: Normocephalic and atraumatic.  Neck: Normal range of motion. Neck supple.  Cardiovascular: Normal rate, regular rhythm, normal heart sounds and intact distal pulses.   Respiratory: Effort normal and breath sounds normal.  GI: Soft.  Musculoskeletal: Normal range of motion.  Neurological: She is alert and oriented to person, place, and time.  Skin: Skin is warm and dry.  Psychiatric: She has a normal mood and affect. Her behavior is normal. Judgment and thought content normal.    Prenatal labs: ABO, Rh: O/Positive/-- (10/20 0000) Antibody: Negative (10/20 0000) Rubella: Immune (05/15 0000) RPR: Nonreactive (10/20 0000)  HBsAg: Negative (05/15 0000)  HIV: Non-reactive (10/20 0000)  GBS: Negative (10/20 0000)   Assessment/Plan: 21 yo female G2P1001 at [redacted]w[redacted]d with SROM and onset of labor  1) Monitor strength and regularity of contractions, fetal movement, NST 2) Allow patient to ambulate as tolerated to encourage progression of dilation   Nira Conn 03/09/2012, 2:41 AM

## 2012-03-09 NOTE — MAU Note (Signed)
Pt had gush of fluid at 0100 and having contractions.

## 2012-03-10 ENCOUNTER — Inpatient Hospital Stay (HOSPITAL_COMMUNITY): Admission: RE | Admit: 2012-03-10 | Payer: Medicaid Other | Source: Ambulatory Visit

## 2012-03-10 MED ORDER — IBUPROFEN 600 MG PO TABS: 600.0000 mg | ORAL_TABLET | Freq: Four times a day (QID) | ORAL | Status: DC

## 2012-03-10 NOTE — Discharge Summary (Signed)
Obstetric Discharge Summary Reason for Admission: onset of labor and rupture of membranes Prenatal Procedures: none Intrapartum Procedures: spontaneous vaginal delivery Postpartum Procedures: none Complications-Operative and Postpartum: none Hemoglobin  Date Value Range Status  03/09/2012 10.1* 12.0 - 15.0 g/dL Final     HCT  Date Value Range Status  03/09/2012 31.0* 36.0 - 46.0 % Final    Physical Exam:  General: alert, cooperative and mild distress Lochia: appropriate Uterine Fundus: firm IDVT Evaluation: No evidence of DVT seen on physical exam.  Discharge Diagnoses: Term Pregnancy-delivered  Discharge Information: Date: 03/10/2012 Activity: pelvic rest Diet: routine Medications: PNV and Ibuprofen Condition: stable Instructions: refer to practice specific booklet Discharge to: home Follow-up Information    Follow up with [facility] [place]. (Make a postpartum appointment for 4-6 weeks)    Contact information:   [address] [place] [address] [phone]         Newborn Data: Live born female  Birth Weight: 7 lb 11 oz (3487 g) APGAR: 8, 9  Home with mother. Breastfeeding going well; considering Mirena for contraception.  Tanya Erickson 03/10/2012, 7:45 AM

## 2012-03-12 NOTE — H&P (Signed)
Patient seen and I agree with note.

## 2012-03-12 NOTE — Progress Notes (Signed)
Post discharge chart review completed.  

## 2012-03-19 NOTE — H&P (Signed)
Medical Screening exam and patient care preformed by advanced practice provider.  Agree with the above management.  

## 2012-11-01 IMAGING — US US OB FOLLOW-UP
1 series · 12 of 28 positions shown · non-contrast
Comparison: none

[Series 1: us ob follow up · 65 acquisitions, 12 frames shown]
[im 3/65]
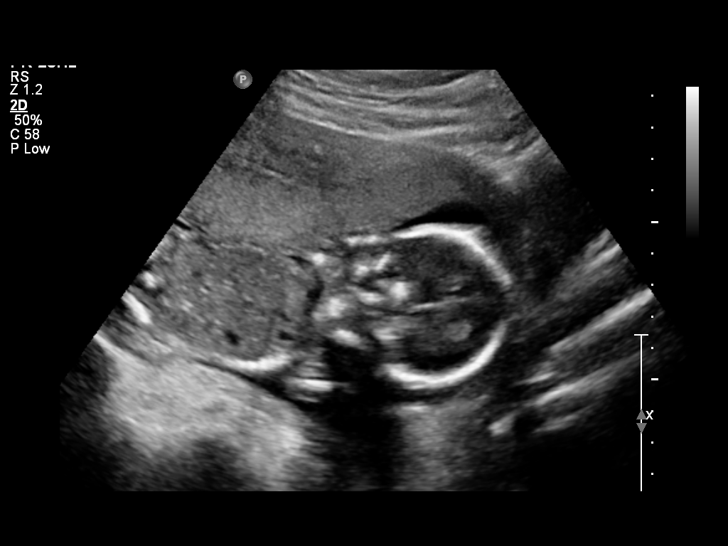
[im 8/65]
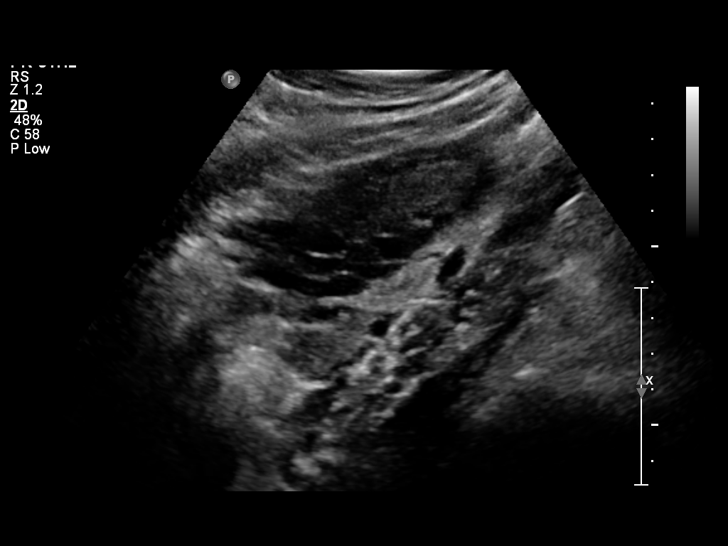
[im 12/65]
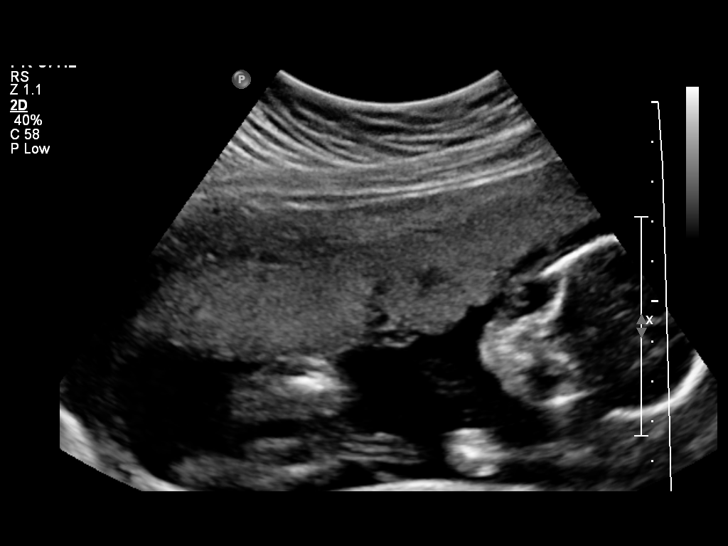
[im 19/65]
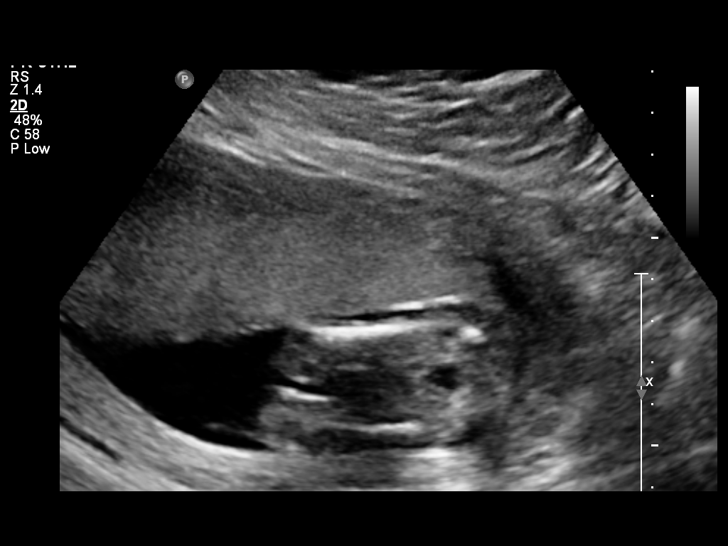
[im 24/65]
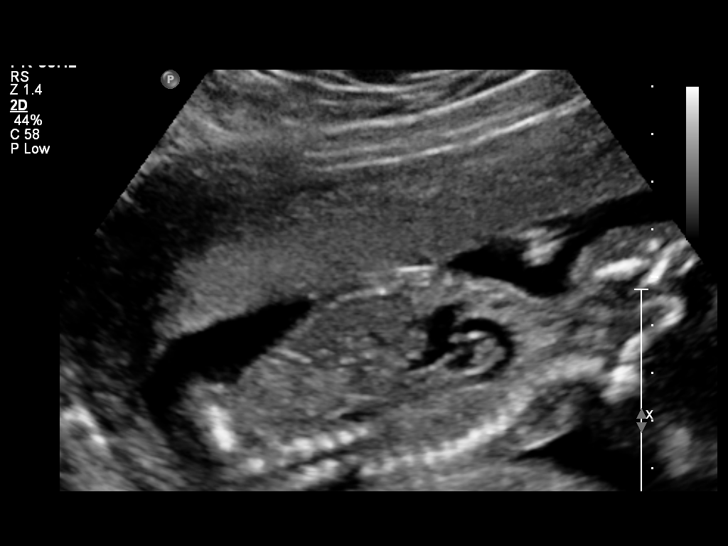
[im 29/65]
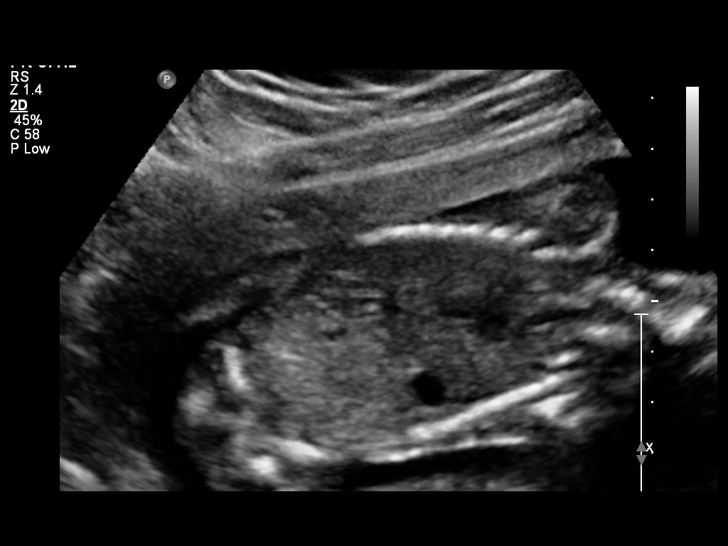
[im 36/65]
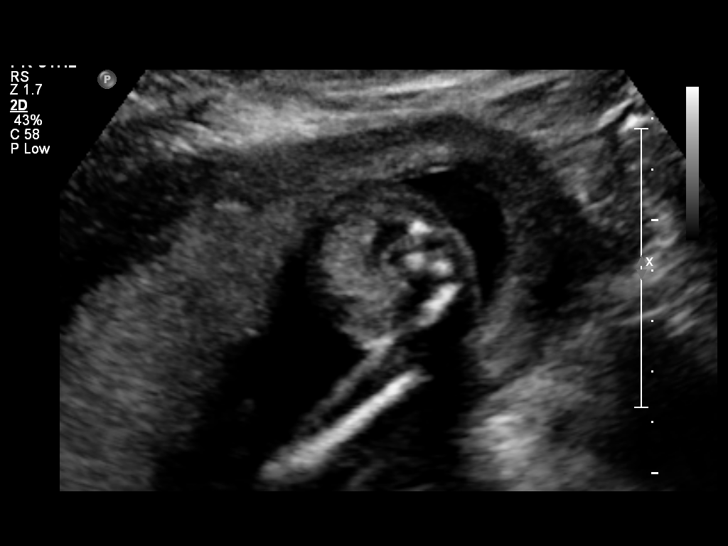
[im 41/65]
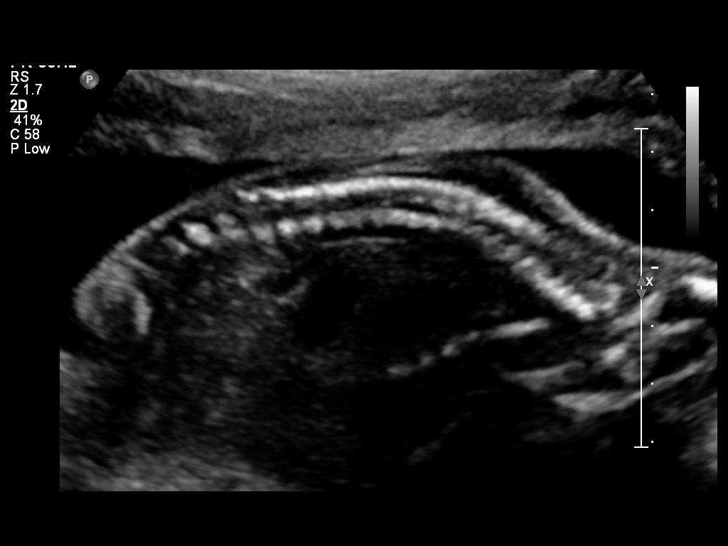
[im 46/65]
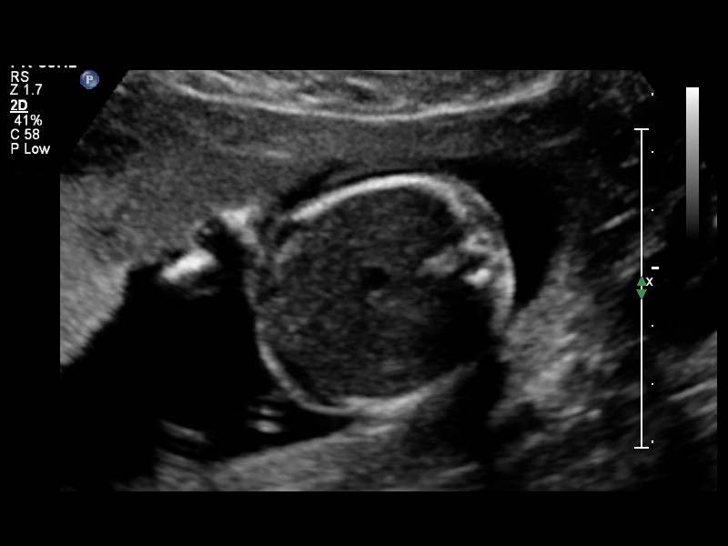
[im 53/65]
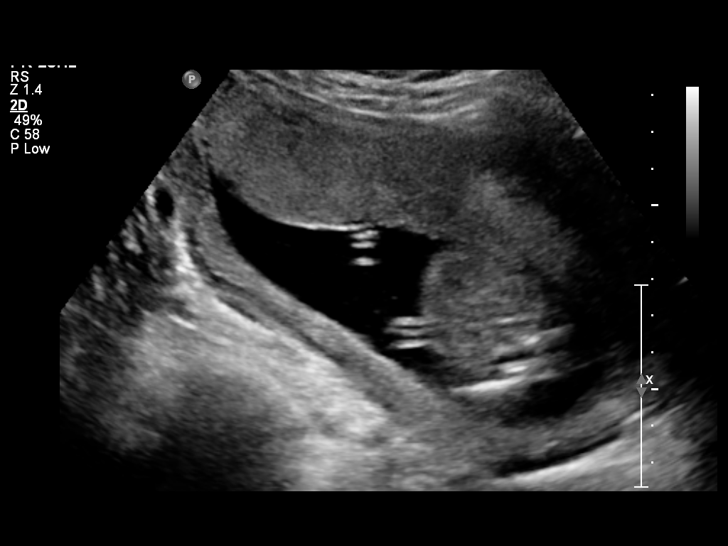
[im 57/65]
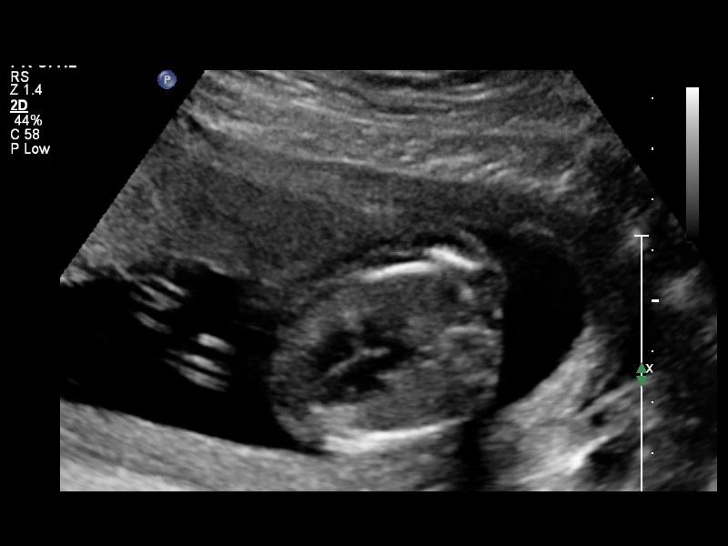
[im 62/65]
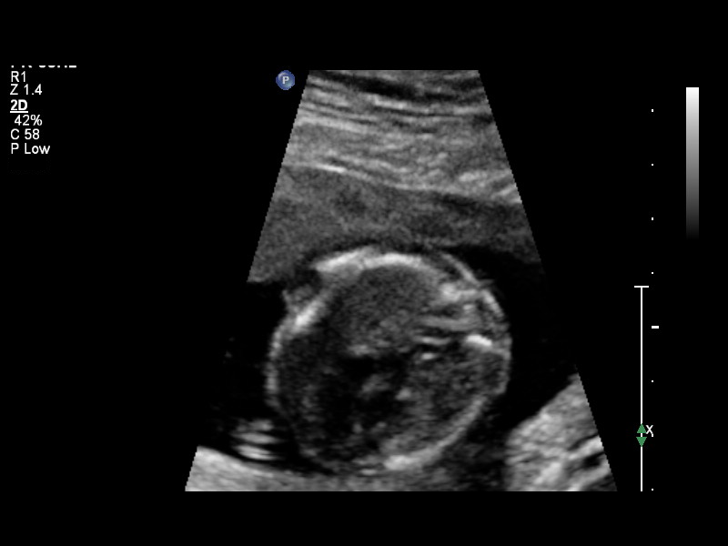

[12 of 28 positions shown; findings below may reference images not displayed]

OBSTETRICS REPORT
                      (Signed Final 10/20/2011 [DATE])

          DREY

 Order#:         [id number]
Procedures

 US OB FOLLOW UP                                       76816.1
Indications

 Follow-up incomplete fetal anatomic evaluation
 Echogenic focus in heart (ECF)
Fetal Evaluation

 Fetal Heart Rate:  147                         bpm
 Cardiac Activity:  Observed
 Presentation:      Cephalic
 Placenta:          Anterior, above cervical os
 P. Cord            Previously Visualized
 Insertion:

 Amniotic Fluid
 AFI FV:      Subjectively within normal limits
                                             Larg Pckt:     4.1  cm
Biometry

 BPD:     47.3  mm    G. Age:   20w 2d                CI:        77.24   70 - 86
                                                      FL/HC:      19.9   16.8 -

 HC:     170.4  mm    G. Age:   19w 4d       40  %    HC/AC:      1.18   1.09 -

 AC:     144.2  mm    G. Age:   19w 5d       46  %    FL/BPD:
 FL:      33.9  mm    G. Age:   20w 5d       75  %    FL/AC:      23.5   20 - 24

 Est. FW:     333  gm    0 lb 12 oz      54  %
Gestational Age

 LMP:           21w 5d       Date:   05/21/11                 EDD:   02/25/12
 U/S Today:     20w 0d                                        EDD:   03/08/12
 Best:          19w 5d    Det. By:   U/S (09/28/11)           EDD:   03/10/12
Anatomy

 Cranium:           Appears normal      Aortic Arch:       Appears normal
 Fetal Cavum:       Appears normal      Ductal Arch:       Appears normal
 Ventricles:        Appears normal      Diaphragm:         Appears normal
 Choroid Plexus:    Previously seen     Stomach:           Appears
                                                           normal, left
                                                           sided
 Cerebellum:        Appears normal      Abdomen:           Appears normal
 Posterior Fossa:   Previously seen     Abdominal Wall:    Appears nml
                                                           (cord insert,
                                                           abd wall)
 Nuchal Fold:       Previously seen     Cord Vessels:      Previously seen
 Face:              Previously seen     Kidneys:           Appear normal
 Heart:             Echogenic           Bladder:           Appears normal
                    focus prev
                    seen in LV
 RVOT:              Appears normal      Spine:             Appears normal
 LVOT:              Appears normal      Limbs:             Previously seen

 Other:     Fetus appears to be a female, Heels, and 5th digit
            visualized. Technically difficult due to fetal position.
Cervix Uterus Adnexa

 Cervical Length:   3.9       cm

 Cervix:       Closed. Normal appearance by transabdominal scan.
 Left Ovary:   Within normal limits.
 Right Ovary:  Within normal limits.
 Adnexa:     No abnormality visualized.
Impression

 Single live IUP in cephalic presentation.  Concordant
 measurements/assigned GA by US.
 No anomaly seen in visualized structures as listed above.

## 2013-04-25 ENCOUNTER — Encounter (HOSPITAL_COMMUNITY): Payer: Self-pay

## 2013-04-30 ENCOUNTER — Encounter (HOSPITAL_COMMUNITY): Payer: Self-pay

## 2013-04-30 ENCOUNTER — Ambulatory Visit (HOSPITAL_COMMUNITY)
Admission: RE | Admit: 2013-04-30 | Discharge: 2013-04-30 | Disposition: A | Discharge status: Home / Self Care | Payer: Self-pay | Source: Ambulatory Visit | Attending: Obstetrics and Gynecology | Admitting: Obstetrics and Gynecology

## 2013-04-30 ENCOUNTER — Encounter (INDEPENDENT_AMBULATORY_CARE_PROVIDER_SITE_OTHER): Payer: Self-pay

## 2013-04-30 VITALS — BP 100/60 | Temp 98.5°F | Ht 64.0 in | Wt 184.4 lb

## 2013-04-30 DIAGNOSIS — R87612 Low grade squamous intraepithelial lesion on cytologic smear of cervix (LGSIL): Secondary | ICD-10-CM | POA: Insufficient documentation

## 2013-04-30 DIAGNOSIS — Z1239 Encounter for other screening for malignant neoplasm of breast: Secondary | ICD-10-CM

## 2013-04-30 DIAGNOSIS — IMO0002 Reserved for concepts with insufficient information to code with codable children: Secondary | ICD-10-CM

## 2013-04-30 NOTE — Patient Instructions (Signed)
Taught Marcene Duos how to perform BSE and gave educational materials to take home. Patient did not need a Pap smear today due to last Pap smear was 01/24/2013. Referred patient to the [facility] for colposcopy to follow up from abnormal Pap smear 01/24/2013. Appointment scheduled for Thursday, June 06, 2012 at 1515. Patient aware of appointment and will be there. Let patient know she will need a screening mammogram at age 22 unless clinically indicated prior.  Marcene Duos verbalized understanding.  Amy Gothard, Kathaleen Maser, RN 3:32 PM

## 2013-04-30 NOTE — Assessment & Plan Note (Signed)
Referred patient to the [facility] for colposcopy to follow up from abnormal Pap smear 01/24/2013. Appointment scheduled for Thursday, June 06, 2012 at 1515.

## 2013-04-30 NOTE — Progress Notes (Signed)
Patient referred to [facility] by the [facility] due to a LGSIL Pap smear recommending a colposcopy for follow up.  Pap Smear:    Pap smear not completed today. Last Pap smear was 01/24/2013 at the [facility] and LGSIL Per patient her 3 previous Pap smears were abnormal also and that she had colposcopies to follow up for all 3 of them. Last Pap smear result is scanned in EPIC under media.  Physical exam: Breasts Breasts symmetrical. No skin abnormalities bilateral breasts. No nipple retraction bilateral breasts. No nipple discharge bilateral breasts. No lymphadenopathy. No lumps palpated bilateral breasts. No complaints of pain or tenderness on exam. Screening mammogram at age 38 unless clinically indicated prior.       Pelvic/Bimanual No Pap smear completed today since last Pap smear was 01/24/2013. Pap smear not indicated per [facility]CP guidelines.

## 2013-06-06 ENCOUNTER — Encounter: Payer: Self-pay | Admitting: Nurse Practitioner

## 2013-06-06 ENCOUNTER — Encounter: Payer: Self-pay | Admitting: Obstetrics & Gynecology

## 2013-06-06 ENCOUNTER — Ambulatory Visit (INDEPENDENT_AMBULATORY_CARE_PROVIDER_SITE_OTHER): Payer: Self-pay | Admitting: Nurse Practitioner

## 2013-06-06 ENCOUNTER — Other Ambulatory Visit (HOSPITAL_COMMUNITY)
Admission: RE | Admit: 2013-06-06 | Discharge: 2013-06-06 | Disposition: A | Discharge status: Home / Self Care | Payer: Self-pay | Source: Ambulatory Visit | Attending: Nurse Practitioner | Admitting: Nurse Practitioner

## 2013-06-06 VITALS — BP 108/75 | HR 81 | Temp 97.9°F | Ht 64.0 in | Wt 187.7 lb

## 2013-06-06 DIAGNOSIS — N871 Moderate cervical dysplasia: Secondary | ICD-10-CM | POA: Insufficient documentation

## 2013-06-06 DIAGNOSIS — R87612 Low grade squamous intraepithelial lesion on cytologic smear of cervix (LGSIL): Secondary | ICD-10-CM

## 2013-06-06 LAB — POCT PREGNANCY, URINE: Preg Test, Ur: NEGATIVE

## 2013-06-06 NOTE — Progress Notes (Addendum)
     GYNECOLOGY CLINIC COLPOSCOPY PROCEDURE NOTE  23 y.o. [id number] here for colposcopy for low-grade squamous intraepithelial neoplasia (LGSIL - encompassing HPV,mild dysplasia,CIN I) pap smear on 01/2013. Discussed role for HPV in cervical dysplasia, need for surveillance.  Patient given informed consent, signed copy in the chart, time out was performed.  Placed in lithotomy position. Cervix viewed with speculum and colposcope after application of acetic acid.   Colposcopy adequate? Yes  visible lesion(s) at 1 and 6  o'clock; biopsies obtained.  ECC specimen obtained. All specimens were labelled and sent to pathology.  Patient was given post procedure instructions.  Will follow up pathology and manage accordingly.  Routine preventative health maintenance measures emphasized.    Amoy Steeves, NP Faculty Practice, [facility]  [facility] Patient

## 2013-06-06 NOTE — Patient Instructions (Signed)
Colposcopa (Colposcopy) La colposcopa es un procedimiento para examinar el cuello del tero y la vagina o la zona externa alrededor de la vagina, para buscar signos de enfermedad o anormalidades. Para realizar este procedimiento se utiliza un microscopio con luz, llamado colposcopio. Durante el procedimiento podrn tomarle muestras de tejido, en caso que el profesional encuentre clulas anormales. La colposcopa se indica si la mujer tiene:  Papanicolau anormal. El Papanicolaou es un examen mdico que se realiza para Chief of Staff las clulas que estn en la superficie del cuello uterino.  Un resultado de Pap que podra indicar la presencia del virus del Engineer, technical sales (VPH). El virus puede producir verrugas genitales y est relacionado con el desarrollo de cncer cervical.  Una lcera en el cuello del tero y el resultado del Pap fue normal.  Se han observado verrugas genitales en el cuello del tero o en la zona externa de la vagina.  Una madre que ha consumido dietilstilbestrol (DES) durante el Media planner.  Relaciones sexuales dolorosas.  Hemorragias vaginales, especialmente despus de Retail banker. INFORME A SU MDICO:  Cualquier alergia que tenga.  Todos los [employer] [place], incluyendo vitaminas, hierbas, gotas oftlmicas, cremas y medicamentos de venta libre.  Problemas previos que usted o los [employer] de su familia hayan tenido con el uso de anestsicos.  Enfermedades de [employer].  Cirugas previas.  Padecimientos mdicos. RIESGOS Y COMPLICACIONES En general, la colposcopa es un procedimiento seguro. Sin embargo, Games developer procedimiento, pueden surgir complicaciones. Las complicaciones posibles son:  Hemorragias.  Infeccin.  Lesiones que no se detectan. ANTES DEL PROCEDIMIENTO   Informe al mdico si tiene el perodo menstrual. En general, la colposcopa no se realiza Paediatric nurse.  Durante las 24 horas previas a la colposcopa  no debe:  Patent attorney.  Usar tampones.  Aplicarse medicamentos, cremas o supositorios en la vagina.  Tener [employer]. PROCEDIMIENTO  Durante el procedimiento, estar acostada sobre la espalda con los pies en los soportes (estribos). Le colocarn el la vagina un instrumento metlico o plstico entibiado espculo) para [place] y permitir al profesional visualizar el cuello del tero. El colposcopio se coloca fuera de la vagina. Este instrumento se [place] para ampliar y examinar el cuello del tero, la vagina y la zona externa de la misma. Se aplica una pequea cantidad de solucin lquida en la zona a observar. Este lquido facilita la observacin de clulas anormales. El mdico utilizar instrumentos para aspirar la mucosidad y las clulas del canal del cuello del tero. Luego registrar la ubicacin de las reas anormales. Si le hacen una biopsia durante el procedimiento, le aplicarn un medicamento para adormecer la zona (anestsico local). Podr sentir Clinical cytogeneticist o clicos leves mientras le hacen la biopsia. Despus del procedimiento, las muestras de tejido Publishing copy durante la biopsia se enviarn al laboratorio para ser [employer]. DESPUS DEL PROCEDIMIENTO  Le darn instrucciones para que concurra al control con su mdico para recibir los [employer]. Es importante que cumpla con todas las visitas. Document Released: 04/25/2005 Document Revised: 12/26/2012 [facility] Patient Information 2014 [place], [place].

## 2013-06-10 ENCOUNTER — Encounter: Payer: Self-pay | Admitting: *Deleted

## 2013-06-12 ENCOUNTER — Telehealth: Payer: Self-pay

## 2013-06-12 NOTE — Telephone Encounter (Signed)
Called pt and informed pt that her colpo results came back abnormal and that she has an appt on 07/11/13 @ 1315 with Dr.Constant in which the provider will discuss a LEEP procedure or other options so that it can get rid of the abnormal cells on her cervix so that it does not lead to cervical cancer.  Pt stated understanding with no further questions.

## 2013-06-12 NOTE — Telephone Encounter (Signed)
Message copied by Faythe CasaBELLAMY, Patti Shorb M on Wed Jun 12, 2013  4:35 PM ------      Message from: Odelia GageLINTON, CHERYL A      Created: Wed Jun 12, 2013  4:07 PM      Regarding: FW: appointment        Leep appointment is on 07/11/2013 @ 1:15. Should I cancel other appointment?                  ----- Message -----         From: Drucilla Schmidtiane L Day, RN         Sent: 06/11/2013  10:47 AM           To: [facility] Admin Pool      Subject: FW: appointment                                          Please schedule appt and return message to the clinical pool. We will notify her.  Thanks       ----- Message -----         From: Delbert PhenixLinda M Barefoot, NP         Sent: 06/11/2013  10:34 AM           To: Drucilla Schmidtiane L Day, RN      Subject: appointment                                              Please make her an appointment for LEEP with MD, thank you so much, Jannifer RodneyLinda Barefoot             ------

## 2013-06-20 ENCOUNTER — Ambulatory Visit: Payer: Self-pay | Admitting: Nurse Practitioner

## 2013-06-28 ENCOUNTER — Ambulatory Visit: Payer: Self-pay | Admitting: Nurse Practitioner

## 2013-07-11 ENCOUNTER — Ambulatory Visit (INDEPENDENT_AMBULATORY_CARE_PROVIDER_SITE_OTHER): Payer: Self-pay | Admitting: Obstetrics and Gynecology

## 2013-07-11 ENCOUNTER — Encounter: Payer: Self-pay | Admitting: Obstetrics and Gynecology

## 2013-07-11 ENCOUNTER — Other Ambulatory Visit (HOSPITAL_COMMUNITY)
Admission: RE | Admit: 2013-07-11 | Discharge: 2013-07-11 | Disposition: A | Discharge status: Home / Self Care | Payer: Self-pay | Source: Ambulatory Visit | Attending: Obstetrics and Gynecology | Admitting: Obstetrics and Gynecology

## 2013-07-11 VITALS — BP 118/80 | HR 90 | Temp 98.5°F | Ht 62.0 in | Wt 192.8 lb

## 2013-07-11 DIAGNOSIS — Z01818 Encounter for other preprocedural examination: Secondary | ICD-10-CM

## 2013-07-11 DIAGNOSIS — N871 Moderate cervical dysplasia: Secondary | ICD-10-CM | POA: Insufficient documentation

## 2013-07-11 DIAGNOSIS — R87612 Low grade squamous intraepithelial lesion on cytologic smear of cervix (LGSIL): Secondary | ICD-10-CM

## 2013-07-11 LAB — POCT PREGNANCY, URINE: PREG TEST UR: NEGATIVE

## 2013-07-11 NOTE — Progress Notes (Signed)
Patient ID: Tanya Erickson, female   DOB: 11/15/1990, 23 y.o.   MRN: [id number] Patient identified, informed consent obtained, signed copy in chart, time out performed.  Pap smear and colposcopy reviewed.   Pap LSIL  Colpo Biopsy CIN II ECC benign Teflon coated speculum with smoke evacuator placed.  Cervix visualized. Paracervical block placed.  Medium size LOOP used to remove cone of cervix using blend of cut and cautery on LEEP machine.  Edges/Base cauterized with Ball.  Monsel's solution used for hemostasis.  Patient tolerated procedure well.  Patient given post procedure instructions.  Follow up in 6 months for repeat pap or as needed.

## 2013-07-16 ENCOUNTER — Telehealth: Payer: Self-pay

## 2013-07-16 NOTE — Telephone Encounter (Signed)
Attempted to call pt. No answer. Left message stating we are calling with results please call clinic at your convenience.

## 2013-07-16 NOTE — Telephone Encounter (Signed)
Message copied by Louanna RawAMPBELL, Itzamara Casas M on Tue Jul 16, 2013  8:35 AM ------      Message from: CONSTANT, PEGGY      Created: Mon Jul 15, 2013  2:57 PM       Please inform patient of LEEP biopsy consistent with colposcopy results. Please remind patient to return for pap smear in 6 months            Thanks            Peggy ------

## 2013-07-17 ENCOUNTER — Encounter: Payer: Self-pay | Admitting: *Deleted

## 2013-07-18 NOTE — Telephone Encounter (Signed)
Called pt. And informed her of results being consistent with colpo results and stated this is what we would expect. Reminded her that she must have a pap smear in 6 months-- September and because we do not book that far in advance advised her to call in July/August to schedule this pap. Pt. Verbalized understanding and had no questions or concerns.

## 2014-03-10 ENCOUNTER — Encounter: Payer: Self-pay | Admitting: Obstetrics and Gynecology

## 2014-03-10 ENCOUNTER — Encounter (HOSPITAL_COMMUNITY): Payer: Self-pay | Admitting: Family Medicine

## 2014-08-01 ENCOUNTER — Telehealth (HOSPITAL_COMMUNITY): Payer: Self-pay | Admitting: *Deleted

## 2014-08-01 NOTE — Telephone Encounter (Signed)
Attempted to call patient with interpreter Nira ConnJulia Sowell [facility]. No one answered phone. Left voicemail for patient to call me back.

## 2015-11-25 ENCOUNTER — Encounter (HOSPITAL_COMMUNITY): Payer: Self-pay | Admitting: *Deleted

## 2016-02-10 ENCOUNTER — Encounter (HOSPITAL_COMMUNITY): Payer: Self-pay | Admitting: Family Medicine

## 2017-09-06 ENCOUNTER — Ambulatory Visit: Payer: Self-pay | Admitting: Obstetrics & Gynecology

## 2017-09-11 ENCOUNTER — Encounter: Payer: Self-pay | Admitting: Obstetrics & Gynecology

## 2017-09-11 ENCOUNTER — Encounter (HOSPITAL_COMMUNITY): Payer: Self-pay

## 2017-09-11 ENCOUNTER — Ambulatory Visit (INDEPENDENT_AMBULATORY_CARE_PROVIDER_SITE_OTHER): Payer: Self-pay | Admitting: Obstetrics & Gynecology

## 2017-09-11 VITALS — BP 116/68 | HR 84 | Ht 67.0 in | Wt 218.0 lb

## 2017-09-11 DIAGNOSIS — Z30432 Encounter for removal of intrauterine contraceptive device: Secondary | ICD-10-CM

## 2017-09-11 DIAGNOSIS — T8332XA Displacement of intrauterine contraceptive device, initial encounter: Secondary | ICD-10-CM

## 2017-09-11 NOTE — Progress Notes (Signed)
   Subjective:    Patient ID: Tanya Erickson, female    DOB: 1990-12-02, 27 y.o.   MRN: [id number]  HPI  27 yo married P2 here for removal of Mirena. She has had it for about 5 years. Two attempts at removal were made at the health dept. The strings are not visible. She reports a lot of pelvic pain, dyspareunia.  She does not want a new IUD when this one is removed.  Review of Systems     Objective:   Physical Exam Breathing, conversing, and ambulating normally Well nourished, well hydrated Latina, no apparent distress I cleaned her cervix with betadine and sprayed Hurricaine spray. I attempted to pass the IUD hook but it would not go in. I then grasped the anterior lip of the cervix with a tenaculum and attempted to pass the IUD hook again but she requested that I stop the procedure due to pain. I complied.     Assessment & Plan:   Retained IUD- I will plan to remove it in the OR with anesthesia She can fill out financial aid paperwork.

## 2017-09-12 ENCOUNTER — Encounter: Payer: Self-pay | Admitting: *Deleted

## 2017-09-20 ENCOUNTER — Encounter: Payer: Self-pay | Admitting: *Deleted

## 2017-10-09 ENCOUNTER — Other Ambulatory Visit: Payer: Self-pay

## 2017-10-09 ENCOUNTER — Encounter (HOSPITAL_BASED_OUTPATIENT_CLINIC_OR_DEPARTMENT_OTHER): Payer: Self-pay | Admitting: Emergency Medicine

## 2017-10-09 NOTE — Progress Notes (Signed)
SPOKE WITH: patient RIDING HOME WITH: fiance Darin Engelsbraham [phone] AM MEDICATIONS: none  NPO STATUS: npo after midnight  LABS: urine preg am of surgery

## 2017-10-10 ENCOUNTER — Ambulatory Visit (HOSPITAL_BASED_OUTPATIENT_CLINIC_OR_DEPARTMENT_OTHER): Payer: Self-pay | Admitting: Anesthesiology

## 2017-10-10 ENCOUNTER — Ambulatory Visit (HOSPITAL_BASED_OUTPATIENT_CLINIC_OR_DEPARTMENT_OTHER)
Admission: RE | Admit: 2017-10-10 | Discharge: 2017-10-10 | Disposition: A | Discharge status: Home / Self Care | Payer: Self-pay | Source: Ambulatory Visit | Attending: Obstetrics & Gynecology | Admitting: Obstetrics & Gynecology

## 2017-10-10 ENCOUNTER — Encounter (HOSPITAL_BASED_OUTPATIENT_CLINIC_OR_DEPARTMENT_OTHER): Payer: Self-pay | Admitting: *Deleted

## 2017-10-10 ENCOUNTER — Encounter (HOSPITAL_BASED_OUTPATIENT_CLINIC_OR_DEPARTMENT_OTHER)
Admission: RE | Disposition: A | Discharge status: Home / Self Care | Payer: Self-pay | Source: Ambulatory Visit | Attending: Obstetrics & Gynecology

## 2017-10-10 DIAGNOSIS — T8332XD Displacement of intrauterine contraceptive device, subsequent encounter: Secondary | ICD-10-CM

## 2017-10-10 DIAGNOSIS — Y831 Surgical operation with implant of artificial internal device as the cause of abnormal reaction of the patient, or of later complication, without mention of misadventure at the time of the procedure: Secondary | ICD-10-CM | POA: Insufficient documentation

## 2017-10-10 DIAGNOSIS — T8332XA Displacement of intrauterine contraceptive device, initial encounter: Secondary | ICD-10-CM | POA: Insufficient documentation

## 2017-10-10 HISTORY — DX: Presence of spectacles and contact lenses: Z97.3

## 2017-10-10 HISTORY — DX: Fibromyalgia: M79.7

## 2017-10-10 HISTORY — PX: IUD REMOVAL: SHX5392

## 2017-10-10 HISTORY — DX: Anxiety disorder, unspecified: F41.9

## 2017-10-10 LAB — POCT PREGNANCY, URINE
PREG TEST UR: NEGATIVE
Preg Test, Ur: NEGATIVE

## 2017-10-10 SURGERY — REMOVAL, INTRAUTERINE DEVICE
Anesthesia: Monitor Anesthesia Care

## 2017-10-10 MED ORDER — PROPOFOL 500 MG/50ML IV EMUL
INTRAVENOUS | Status: DC | PRN
  Administered 2017-10-10: 200 ug/kg/min via INTRAVENOUS

## 2017-10-10 MED ORDER — LIDOCAINE HCL (CARDIAC) PF 100 MG/5ML IV SOSY
PREFILLED_SYRINGE | INTRAVENOUS | Status: DC | PRN
  Administered 2017-10-10: 25 mg via INTRAVENOUS

## 2017-10-10 MED ORDER — DEXAMETHASONE SODIUM PHOSPHATE 10 MG/ML IJ SOLN
INTRAMUSCULAR | Status: AC
  Filled 2017-10-10: qty 1

## 2017-10-10 MED ORDER — KETOROLAC TROMETHAMINE 30 MG/ML IJ SOLN
INTRAMUSCULAR | Status: AC
  Filled 2017-10-10: qty 1

## 2017-10-10 MED ORDER — OXYCODONE HCL 5 MG/5ML PO SOLN
5.0000 mg | Freq: Once | ORAL | Status: DC | PRN
  Filled 2017-10-10: qty 5

## 2017-10-10 MED ORDER — HYDROMORPHONE HCL 1 MG/ML IJ SOLN
0.2500 mg | INTRAMUSCULAR | Status: DC | PRN
  Administered 2017-10-10: 0.5 mg via INTRAVENOUS
  Filled 2017-10-10: qty 0.5

## 2017-10-10 MED ORDER — MIDAZOLAM HCL 2 MG/2ML IJ SOLN
INTRAMUSCULAR | Status: AC
  Filled 2017-10-10: qty 2

## 2017-10-10 MED ORDER — FENTANYL CITRATE (PF) 100 MCG/2ML IJ SOLN
INTRAMUSCULAR | Status: AC
  Filled 2017-10-10: qty 2

## 2017-10-10 MED ORDER — ONDANSETRON HCL 4 MG/2ML IJ SOLN
INTRAMUSCULAR | Status: AC
  Filled 2017-10-10: qty 2

## 2017-10-10 MED ORDER — BUPIVACAINE HCL (PF) 0.5 % IJ SOLN
INTRAMUSCULAR | Status: DC | PRN
  Administered 2017-10-10: 20 mL

## 2017-10-10 MED ORDER — PROMETHAZINE HCL 25 MG/ML IJ SOLN
6.2500 mg | INTRAMUSCULAR | Status: DC | PRN
  Filled 2017-10-10: qty 1

## 2017-10-10 MED ORDER — FENTANYL CITRATE (PF) 100 MCG/2ML IJ SOLN
INTRAMUSCULAR | Status: DC | PRN
  Administered 2017-10-10: 50 ug via INTRAVENOUS

## 2017-10-10 MED ORDER — IBUPROFEN 600 MG PO TABS: 600.0000 mg | ORAL_TABLET | Freq: Four times a day (QID) | ORAL | 1 refills | Status: AC | PRN

## 2017-10-10 MED ORDER — LIDOCAINE 2% (20 MG/ML) 5 ML SYRINGE
INTRAMUSCULAR | Status: AC
  Filled 2017-10-10: qty 5

## 2017-10-10 MED ORDER — LACTATED RINGERS IV SOLN
INTRAVENOUS | Status: DC
  Administered 2017-10-10: 08:00:00 via INTRAVENOUS
  Filled 2017-10-10: qty 1000

## 2017-10-10 MED ORDER — DEXAMETHASONE SODIUM PHOSPHATE 4 MG/ML IJ SOLN
INTRAMUSCULAR | Status: DC | PRN
  Administered 2017-10-10: 5 mg via INTRAVENOUS

## 2017-10-10 MED ORDER — MIDAZOLAM HCL 5 MG/5ML IJ SOLN
INTRAMUSCULAR | Status: DC | PRN
  Administered 2017-10-10: 2 mg via INTRAVENOUS

## 2017-10-10 MED ORDER — HYDROMORPHONE HCL 1 MG/ML IJ SOLN
INTRAMUSCULAR | Status: AC
  Filled 2017-10-10: qty 1

## 2017-10-10 MED ORDER — OXYCODONE HCL 5 MG PO TABS
5.0000 mg | ORAL_TABLET | Freq: Once | ORAL | Status: DC | PRN
  Filled 2017-10-10: qty 1

## 2017-10-10 MED ORDER — KETOROLAC TROMETHAMINE 30 MG/ML IJ SOLN
INTRAMUSCULAR | Status: DC | PRN
  Administered 2017-10-10: 30 mg via INTRAVENOUS

## 2017-10-10 MED ORDER — PROPOFOL 500 MG/50ML IV EMUL
INTRAVENOUS | Status: AC
  Filled 2017-10-10: qty 50

## 2017-10-10 SURGICAL SUPPLY — 10 items
DILATOR CANAL MILEX (MISCELLANEOUS) IMPLANT
GLOVE BIO SURGEON STRL SZ 6.5 (GLOVE) ×2 IMPLANT
GLOVE BIO SURGEONS STRL SZ 6.5 (GLOVE) ×1
GLOVE BIOGEL PI IND STRL 7.0 (GLOVE) ×1 IMPLANT
GLOVE BIOGEL PI INDICATOR 7.0 (GLOVE) ×2
GOWN STRL REUS W/TWL LRG LVL3 (GOWN DISPOSABLE) ×6 IMPLANT
PACK VAGINAL MINOR WOMEN LF (CUSTOM PROCEDURE TRAY) ×3 IMPLANT
PAD OB MATERNITY 4.3X12.25 (PERSONAL CARE ITEMS) ×3 IMPLANT
PAD PREP 24X48 CUFFED NSTRL (MISCELLANEOUS) ×3 IMPLANT
TOWEL OR 17X24 6PK STRL BLUE (TOWEL DISPOSABLE) ×6 IMPLANT

## 2017-10-10 NOTE — Transfer of Care (Signed)
Immediate Anesthesia Transfer of Care Note  Patient: Tanya Erickson  Procedure(s) Performed: INTRAUTERINE DEVICE (IUD) REMOVAL (N/A )  Patient Location: PACU  Anesthesia Type:MAC  Level of Consciousness: awake, alert , oriented and patient cooperative  Airway & Oxygen Therapy: Patient Spontanous Breathing and Patient connected to nasal cannula oxygen  Post-op Assessment: Report given to RN and Post -op Vital signs reviewed and stable  Post vital signs: Reviewed and stable  Last Vitals:  Vitals Value Taken Time  BP 117/74 10/10/2017  9:32 AM  Temp 36.9 C 10/10/2017  9:32 AM  Pulse 65 10/10/2017  9:34 AM  Resp 13 10/10/2017  9:34 AM  SpO2 100 % 10/10/2017  9:34 AM  Vitals shown include unvalidated device data.  Last Pain:  Vitals:   10/10/17 0635  TempSrc: Oral         Complications: No apparent anesthesia complications

## 2017-10-10 NOTE — Discharge Instructions (Signed)
Nothing per vagina for a week. I would recommend that she have the Paragard placed prior to unprotected sex if she does not desire a pregnancy.   Post Anesthesia Home Care Instructions  Activity: Get plenty of rest for the remainder of the day. A responsible individual must stay with you for 24 hours following the procedure.  For the next 24 hours, DO NOT: -Drive a car -Advertising copywriterperate machinery -Drink alcoholic beverages -Take any medication unless instructed by your physician -Make any legal decisions or sign important papers.  Meals: Start with liquid foods such as gelatin or soup. Progress to regular foods as tolerated. Avoid greasy, spicy, heavy foods. If nausea and/or vomiting occur, drink only clear liquids until the nausea and/or vomiting subsides. Call your physician if vomiting continues.  Special Instructions/Symptoms: Your throat may feel dry or sore from the anesthesia or the breathing tube placed in your throat during surgery. If this causes discomfort, gargle with warm salt water. The discomfort should disappear within 24 hours.  If you had a scopolamine patch placed behind your ear for the management of post- operative nausea and/or vomiting:  1. The medication in the patch is effective for 72 hours, after which it should be removed.  Wrap patch in a tissue and discard in the trash. Wash hands thoroughly with soap and water. 2. You may remove the patch earlier than 72 hours if you experience unpleasant side effects which may include dry mouth, dizziness or visual disturbances. 3. Avoid touching the patch. Wash your hands with soap and water after contact with the patch.

## 2017-10-10 NOTE — Anesthesia Procedure Notes (Signed)
Procedure Name: MAC Date/Time: 10/10/2017 9:00 AM Performed by: Wanita Chamberlain, CRNA Pre-anesthesia Checklist: Patient identified, Emergency Drugs available, Suction available, Patient being monitored and Timeout performed Patient Re-evaluated:Patient Re-evaluated prior to induction Oxygen Delivery Method: Nasal cannula Preoxygenation: Pre-oxygenation with 100% oxygen Induction Type: IV induction

## 2017-10-10 NOTE — Op Note (Signed)
10/10/2017  9:24 AM  PATIENT:  Tanya Erickson  27 y.o. female  PRE-OPERATIVE DIAGNOSIS: Retained IUD   POST-OPERATIVE DIAGNOSIS:  Retained IUD  PROCEDURE:  Procedure(s): INTRAUTERINE DEVICE (IUD) REMOVAL (N/A)  SURGEON:  Surgeon(s) and Role:    * Yashas Camilli C, MD - Primary  ANESTHESIA:   local and MAC  EBL:  minimal   BLOOD ADMINISTERED:none  DRAINS: none   LOCAL MEDICATIONS USED:  MARCAINE     SPECIMEN:  No Specimen  DISPOSITION OF SPECIMEN:  N/A  COUNTS:  YES  TOURNIQUET:  * No tourniquets in log *  DICTATION: .Dragon Dictation  PLAN OF CARE: Discharge to home after PACU  PATIENT DISPOSITION:  PACU - hemodynamically stable.   Delay start of Pharmacological VTE agent (>24hrs) due to surgical blood loss or risk of bleeding: not applicable    The risks, benefits, and alternatives of surgery were explained, understood, and accepted. All questions were answered. Consents were signed. In the operating room MAC anesthesia was applied without complication, and she was placed in the dorsal lithotomy position. Her vagina was prepped and draped in the usual sterile fashion.  A speculum was placed and a single-tooth tenaculum was used to grasp the anterior lip of her cervix. A total of 20 mL of 0.5% Marcaine was used to perform a paracervical block. I used an IUD hook to pull the IUD strings out of the cervix. They were grasped and the intact IUD was removed. There was no bleeding noted at the end of the case. She was taken to the recovery room after being extubated. She tolerated the procedure well.

## 2017-10-10 NOTE — Anesthesia Preprocedure Evaluation (Addendum)
Anesthesia Evaluation  Patient identified by MRN, date of birth, ID band Patient awake    Reviewed: Allergy & Precautions, H&P , NPO status , Patient's Chart, lab work & pertinent test results  Airway Mallampati: I  TM Distance: >3 FB Neck ROM: Full    Dental no notable dental hx. (+) Teeth Intact, Dental Advisory Given   Pulmonary    Pulmonary exam normal breath sounds clear to auscultation       Cardiovascular Normal cardiovascular exam Rhythm:Regular Rate:Normal     Neuro/Psych Anxiety    GI/Hepatic   Endo/Other    Renal/GU      Musculoskeletal  (+) Fibromyalgia -  Abdominal (+) + obese,   Peds  Hematology  (+) REFUSES BLOOD PRODUCTS, JEHOVAH'S WITNESS (Pt has signed consent for albumin only to be given.)  Anesthesia Other Findings   Reproductive/Obstetrics                             Anesthesia Physical  Anesthesia Plan  ASA: II  Anesthesia Plan: MAC   Post-op Pain Management:    Induction: Intravenous  PONV Risk Score and Plan: 2 and Ondansetron and Midazolam  Airway Management Planned: Simple Face Mask  Additional Equipment:   Intra-op Plan:   Post-operative Plan:   Informed Consent: I have reviewed the patients History and Physical, chart, labs and discussed the procedure including the risks, benefits and alternatives for the proposed anesthesia with the patient or authorized representative who has indicated his/her understanding and acceptance.   Dental advisory given  Plan Discussed with: CRNA, Anesthesiologist and Surgeon  Anesthesia Plan Comments:         Anesthesia Quick Evaluation

## 2017-10-10 NOTE — H&P (Signed)
  27 yo married P2 here for removal of Mirena. She has had it for about 5 years. Two attempts at removal were made at the health dept. The strings are not visible. She reports a lot of pelvic pain, dyspareunia. She reports an u/s done at the health dept that showed the IUD to be within the uterus. She does not have periods with this IUD.      Menstrual History: Menarche age: *[age] No LMP recorded (lmp unknown). (Menstrual status: IUD).    Past Medical History:  Diagnosis Date  . Abnormal Pap smear   . Anemia   . Anxiety   . Fibromyalgia    dx age 89, was being managed by "a specialist" until age 27, states now manages on her own with natural lifestyle changes   . Wears glasses     Past Surgical History:  Procedure Laterality Date  . APPENDECTOMY    . INTRAUTERINE DEVICE INSERTION    . LAPAROSCOPIC APPENDECTOMY  10/02/2011   Procedure: APPENDECTOMY LAPAROSCOPIC;  Surgeon: Wilmon ArmsMatthew K. Corliss Skainssuei, MD;  Location: [facility] OR;  Service: General;  Laterality: N/A;    Family History  Problem Relation Age of Onset  . Cancer Sister        Colon & ovarian cancer  . Cancer Maternal Aunt   . Diabetes Maternal Uncle   . Diabetes Maternal Grandmother   . Diabetes Maternal Uncle   . Diabetes Maternal Uncle   . Diabetes Maternal Uncle   . Heart disease Paternal Uncle   . Diabetes Maternal Grandfather   . Heart disease Paternal Grandfather     Social History:  reports that she has never smoked. She has never used smokeless tobacco. She reports that she does not drink alcohol or use drugs.  Allergies:  Allergies  Allergen Reactions  . Mushroom Extract Complex Anaphylaxis  . Mushroom Extract Complex Shortness Of Breath    Medications Prior to Admission  Medication Sig Dispense Refill Last Dose  . levonorgestrel (MIRENA) 20 MCG/24HR IUD 1 each by Intrauterine route once.     . Multiple Vitamin (MULTIVITAMIN) capsule Take 1 capsule by mouth daily.   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 rate 16, height 5\' 7"  (1.702 m), weight 101.1 kg (222 lb 14.4 oz), SpO2 100 %. Physical Exam  Heart- rrr Lungs- CTAB Abd- benign  Results for orders placed or performed during the hospital encounter of 10/10/17 (from the past 24 hour(s))  Pregnancy, urine POC     Status: None   Collection Time: 10/10/17  7:12 AM  Result Value Ref Range   Preg Test, Ur NEGATIVE NEGATIVE    No results found.  Assessment/Plan: Retained IUD- plan for removal in the OR She plans to get a copper IUD at the health dept in the future.  Allie BossierMyra C Inri Sobieski 10/10/2017, 7:19 AM

## 2017-10-10 NOTE — Anesthesia Postprocedure Evaluation (Signed)
Anesthesia Post Note  Patient: Tanya Erickson  Procedure(s) Performed: INTRAUTERINE DEVICE (IUD) REMOVAL (N/A )     Patient location during evaluation: PACU Anesthesia Type: MAC Level of consciousness: awake and alert Pain management: pain level controlled Vital Signs Assessment: post-procedure vital signs reviewed and stable Respiratory status: spontaneous breathing, nonlabored ventilation and respiratory function stable Cardiovascular status: stable and blood pressure returned to baseline Postop Assessment: no apparent nausea or vomiting Anesthetic complications: no    Last Vitals:  Vitals:   10/10/17 1000 10/10/17 1130  BP: 113/77 115/77  Pulse: 61 71  Resp: 12 13  Temp:  36.7 C  SpO2: 97% 99%    Last Pain:  Vitals:   10/10/17 1100  TempSrc:   PainSc: [place]

## 2017-10-12 ENCOUNTER — Encounter (HOSPITAL_BASED_OUTPATIENT_CLINIC_OR_DEPARTMENT_OTHER): Payer: Self-pay | Admitting: Obstetrics & Gynecology

## 2018-08-13 ENCOUNTER — Emergency Department (HOSPITAL_COMMUNITY)
Admission: EM | Admit: 2018-08-13 | Discharge: 2018-08-13 | Disposition: A | Discharge status: Home / Self Care | Payer: Self-pay | Attending: Emergency Medicine | Admitting: Emergency Medicine

## 2018-08-13 ENCOUNTER — Emergency Department (HOSPITAL_COMMUNITY): Payer: Self-pay

## 2018-08-13 ENCOUNTER — Other Ambulatory Visit: Payer: Self-pay

## 2018-08-13 ENCOUNTER — Encounter (HOSPITAL_COMMUNITY): Payer: Self-pay

## 2018-08-13 DIAGNOSIS — Z79899 Other long term (current) drug therapy: Secondary | ICD-10-CM | POA: Insufficient documentation

## 2018-08-13 DIAGNOSIS — K529 Noninfective gastroenteritis and colitis, unspecified: Secondary | ICD-10-CM

## 2018-08-13 DIAGNOSIS — K388 Other specified diseases of appendix: Secondary | ICD-10-CM | POA: Insufficient documentation

## 2018-08-13 DIAGNOSIS — N83202 Unspecified ovarian cyst, left side: Secondary | ICD-10-CM | POA: Insufficient documentation

## 2018-08-13 DIAGNOSIS — R102 Pelvic and perineal pain: Secondary | ICD-10-CM | POA: Insufficient documentation

## 2018-08-13 DIAGNOSIS — K6389 Other specified diseases of intestine: Secondary | ICD-10-CM

## 2018-08-13 LAB — LIPASE, BLOOD: Lipase: 29 U/L (ref 11–51)

## 2018-08-13 LAB — URINALYSIS, ROUTINE W REFLEX MICROSCOPIC
Bilirubin Urine: NEGATIVE
Glucose, UA: NEGATIVE mg/dL
Ketones, ur: NEGATIVE mg/dL
Nitrite: NEGATIVE
Protein, ur: NEGATIVE mg/dL
Specific Gravity, Urine: 1.021 (ref 1.005–1.030)
pH: 5 (ref 5.0–8.0)

## 2018-08-13 LAB — I-STAT BETA HCG BLOOD, ED (MC, WL, AP ONLY): I-stat hCG, quantitative: <5 m[IU]/mL (ref <5)

## 2018-08-13 LAB — COMPREHENSIVE METABOLIC PANEL
ALT: 30 U/L (ref 0–44)
AST: 21 U/L (ref 15–41)
Albumin: 4.3 g/dL (ref 3.5–5.0)
Alkaline Phosphatase: 62 U/L (ref 38–126)
Anion gap: 10 (ref 5–15)
BUN: 12 mg/dL (ref 6–20)
CO2: 21 mmol/L — ABNORMAL LOW (ref 22–32)
Calcium: 9.2 mg/dL (ref 8.9–10.3)
Chloride: 107 mmol/L (ref 98–111)
Creatinine, Ser: 0.65 mg/dL (ref 0.44–1.00)
GFR calc Af Amer: >60 mL/min (ref >60)
GFR calc non Af Amer: >60 mL/min (ref >60)
Glucose, Bld: 94 mg/dL (ref 70–99)
Potassium: 3.5 mmol/L (ref 3.5–5.1)
Sodium: 138 mmol/L (ref 135–145)
Total Bilirubin: 0.4 mg/dL (ref 0.3–1.2)
Total Protein: 8.1 g/dL (ref 6.5–8.1)

## 2018-08-13 LAB — CBC
HCT: 39.1 % (ref 36.0–46.0)
Hemoglobin: 13.2 g/dL (ref 12.0–15.0)
MCH: 30.1 pg (ref 26.0–34.0)
MCHC: 33.8 g/dL (ref 30.0–36.0)
MCV: 89.1 fL (ref 80.0–100.0)
Platelets: 274 10*3/uL (ref 150–400)
RBC: 4.39 MIL/uL (ref 3.87–5.11)
RDW: 11.9 % (ref 11.5–15.5)
WBC: 5.4 10*3/uL (ref 4.0–10.5)
nRBC: 0 % (ref 0.0–0.2)

## 2018-08-13 MED ORDER — SODIUM CHLORIDE (PF) 0.9 % IJ SOLN
INTRAMUSCULAR | Status: AC
  Filled 2018-08-13: qty 50

## 2018-08-13 MED ORDER — SODIUM CHLORIDE 0.9% FLUSH
3.0000 mL | Freq: Once | INTRAVENOUS | Status: AC
  Administered 2018-08-13: 12:00:00 3 mL via INTRAVENOUS

## 2018-08-13 MED ORDER — OXYCODONE-ACETAMINOPHEN 5-325 MG PO TABS
2.0000 | ORAL_TABLET | Freq: Once | ORAL | Status: AC
  Administered 2018-08-13: 2 via ORAL
  Filled 2018-08-13: qty 2

## 2018-08-13 MED ORDER — ONDANSETRON 8 MG PO TBDP
8.0000 mg | ORAL_TABLET | Freq: Once | ORAL | Status: AC
  Administered 2018-08-13: 17:00:00 8 mg via ORAL
  Filled 2018-08-13: qty 1

## 2018-08-13 MED ORDER — IOHEXOL 300 MG/ML  SOLN
100.0000 mL | Freq: Once | INTRAMUSCULAR | Status: AC | PRN
  Administered 2018-08-13: 15:00:00 100 mL via INTRAVENOUS

## 2018-08-13 NOTE — ED Notes (Signed)
Patient given discharge teaching and verbalized understanding. Patient ambulated out of ED with a steady gait. 

## 2018-08-13 NOTE — ED Provider Notes (Signed)
[facility]-EMERGENCY DEPT Provider Note   CSN: [id number] Arrival date & time: 08/13/18  1145    History   Chief Complaint Chief Complaint  Patient presents with  . Abdominal Pain    HPI Tanya Erickson is a 28 y.o. female.     HPI 28 year old female history of fibromyalgia, anxiety presents today complaining of left lower quadrant pain.  States it began 3 days ago and has been constant in nature.  There is some increase with certain movements, coughing, and sneezing.  She has had no change in appetite, nausea, vomiting, or diarrhea.  Has a similar pain in the past.  She states that she began her menstrual cycle 2 days ago.  She states it came early.  She normally through expect 4.  Start on April 13 was started on the first.  She normally has regular periods.  She denies any abnormal vaginal discharge, dyspareunia, or history of STIs.  She states she has had one sexual partner in the past year and 2 total.  She is G2 P2.  Not currently using any birth control.  Does not note fever, cough, chest pain, urinary tract symptoms infection symptoms, hematuria, or frequency. Past Medical History:  Diagnosis Date  . Abnormal Pap smear   . Anemia   . Anxiety   . Fibromyalgia    dx age 89, was being managed by "a specialist" until age 28, states now manages on her own with natural lifestyle changes   . Wears glasses     Patient Active Problem List   Diagnosis Date Noted  . Pap smear abnormality of cervix with LGSIL 04/30/2013  . LSIL (low grade squamous intraepithelial lesion) on Pap smear 03/09/2012  . Refusal of blood transfusions as patient is Jehovah's Witness 03/09/2012  . Supervision of normal pregnancy 03/09/2012  . Acute appendicitis 10/27/2011    Past Surgical History:  Procedure Laterality Date  . APPENDECTOMY    . INTRAUTERINE DEVICE INSERTION    . IUD REMOVAL N/A 10/10/2017   Procedure: INTRAUTERINE DEVICE (IUD) REMOVAL;  Surgeon: Allie Bossierove, Myra  C, MD;  [facility];  Service: Gynecology;  Laterality: N/A;  . LAPAROSCOPIC APPENDECTOMY  10/02/2011   Procedure: APPENDECTOMY LAPAROSCOPIC;  Surgeon: Wilmon ArmsMatthew K. Corliss Skainssuei, MD;  Location: [facility] OR;  Service: General;  Laterality: N/A;     OB History    Gravida  4   Para  4   Term  4   Preterm  0   AB  0   Living  2     SAB  0   TAB  0   Ectopic  0   Multiple      Live Births  2            Home Medications    Prior to Admission medications   Medication Sig Start Date End Date Taking? Authorizing Provider  levonorgestrel (MIRENA) 20 MCG/24HR IUD 1 each by Intrauterine route once.   Yes [provider]  Multiple Vitamin (MULTIVITAMIN) capsule Take 1 capsule by mouth daily.   Yes [provider]  ibuprofen (ADVIL,MOTRIN) 600 MG tablet Take 1 tablet (600 mg total) by mouth every 6 (six) hours as needed. Patient not taking: Reported on 08/13/2018 10/10/17   Allie Bossierove, Myra C, MD    Family History Family History  Problem Relation Age of Onset  . Cancer Sister        Colon & ovarian cancer  . Cancer Maternal Aunt   .  Diabetes Maternal Uncle   . Diabetes Maternal Grandmother   . Diabetes Maternal Uncle   . Diabetes Maternal Uncle   . Diabetes Maternal Uncle   . Heart disease Paternal Uncle   . Diabetes Maternal Grandfather   . Heart disease Paternal Grandfather     Social History Social History   Tobacco Use  . Smoking status: Never Smoker  . Smokeless tobacco: Never Used  Substance Use Topics  . Alcohol use: No  . Drug use: No     Allergies   Mushroom extract complex and Mushroom extract complex   Review of Systems Review of Systems  All other systems reviewed and are negative.    Physical Exam Updated Vital Signs BP 125/77   Pulse 89   Temp 98 F (36.7 C) (Oral)   Resp 17   Ht 1.676 m (5\' 6" )   Wt 91.2 kg   LMP 08/06/2018   SpO2 100%   BMI 32.44 kg/m   Physical Exam Vitals signs and nursing note  reviewed.  Constitutional:      Appearance: She is well-developed and normal weight.  HENT:     Head: Atraumatic.     Mouth/Throat:     Mouth: Mucous membranes are moist.  Eyes:     Extraocular Movements: Extraocular movements intact.  Cardiovascular:     Rate and Rhythm: Normal rate.  Pulmonary:     Effort: Pulmonary effort is normal.     Breath sounds: Normal breath sounds.  Abdominal:     General: Abdomen is flat. Bowel sounds are normal.     Palpations: Abdomen is soft.     Tenderness: There is abdominal tenderness in the left lower quadrant.  Genitourinary:    Vagina: Normal.     Cervix: No cervical motion tenderness.     Uterus: Tender.      Adnexa: Right adnexa normal.       Left: Tenderness present.      Comments: iud string from os Skin:    General: Skin is warm.     Capillary Refill: Capillary refill takes less than 2 seconds.  Neurological:     General: No focal deficit present.     Mental Status: She is alert.  Psychiatric:        Mood and Affect: Mood normal.      ED Treatments / Results  Labs (all labs ordered are listed, but only abnormal results are displayed) Labs Reviewed  COMPREHENSIVE METABOLIC PANEL - Abnormal; Notable for the following components:      Result Value   CO2 21 (*)    All other components within normal limits  URINALYSIS, ROUTINE W REFLEX MICROSCOPIC - Abnormal; Notable for the following components:   APPearance HAZY (*)    Hgb urine dipstick MODERATE (*)    Leukocytes,Ua MODERATE (*)    Bacteria, UA RARE (*)    All other components within normal limits  LIPASE, BLOOD  CBC  I-STAT BETA HCG BLOOD, ED ([facility], [facility], [facility] ONLY)    EKG None  Radiology Ct Abdomen Pelvis W Contrast  Result Date: 08/13/2018 CLINICAL DATA:  Left lower abdominal pain for 2 days EXAM: CT ABDOMEN AND PELVIS WITH CONTRAST TECHNIQUE: Multidetector CT imaging of the abdomen and pelvis was performed using the standard protocol following bolus administration of  intravenous contrast. CONTRAST:  OMNIPAQUE IOHEXOL 300 MG/ML  SOLN COMPARISON:  None. FINDINGS: Lower chest: No acute abnormality. Hepatobiliary: Low attenuation of the liver as can be seen with hepatic  steatosis. No focal hepatic mass. No intrahepatic or extrahepatic biliary ductal dilatation. Normal gallbladder. Pancreas: Unremarkable. No pancreatic ductal dilatation or surrounding inflammatory changes. Spleen: Normal in size without focal abnormality. Adrenals/Urinary Tract: Adrenal glands are unremarkable. Kidneys are normal, without renal calculi, focal lesion, or hydronephrosis. Bladder is unremarkable. Stomach/Bowel: Stomach is within normal limits. Prior appendectomy. No evidence of bowel wall thickening, distention, or inflammatory changes. 2 cm ovoid fat attenuating masslike area along the antimesenteric aspect of the distal descending colon with surrounding inflammatory changes and central soft tissue density most consistent with epiploic appendagitis. Vascular/Lymphatic: No significant vascular findings are present. No enlarged abdominal or pelvic lymph nodes. Reproductive: 5 cm hypodense, fluid attenuating left adnexal mass likely reflecting a cyst. Intrauterine device within the uterus. Other: No abdominal wall hernia or abnormality. No abdominopelvic ascites. Musculoskeletal: No acute osseous abnormality. No aggressive osseous lesion. IMPRESSION: 1. Epiploic appendagitis of the distal descending colon. 2. Left ovarian cyst measuring 5 cm. Electronically Signed   By: Elige Ko   On: 08/13/2018 15:43    Procedures Procedures (including critical care time)  Medications Ordered in ED Medications  oxyCODONE-acetaminophen (PERCOCET/ROXICET) 5-325 MG per tablet 2 tablet (has no administration in time range)  sodium chloride flush (NS) 0.9 % injection 3 mL (3 mLs Intravenous Given 08/13/18 1226)     Initial Impression / Assessment and Plan / ED Course  I have reviewed the triage vital  signs and the nursing notes.  Pertinent labs & imaging results that were available during my care of the patient were reviewed by me and considered in my medical decision making (see chart for details).      28 year old female presents today with onset of left lower quadrant pain 3 days ago.  Exam and CT significant for epiploic appendagitis and left ovarian cyst.  Symptoms could be due to either of these conditions.  Patient advised and advised regarding follow-up and pain management.   Final Clinical Impressions(s) / ED Diagnoses   Final diagnoses:  Epiploic appendagitis  Cyst of left ovary    ED Discharge Orders    None       Margarita Grizzle, MD 08/13/18 1627

## 2018-08-13 NOTE — Discharge Instructions (Addendum)
Pain today may be due to either epiploic appendagitis or an ovarian cyst, both of which were present today on the CAT scan.  Epiploic appendagitis is usually a self-limiting condition.  The ovarian cyst also is likely to resolve on its own.  However, use ibuprofen for pain and call your gynecologist for follow-up.

## 2018-08-13 NOTE — ED Triage Notes (Signed)
C/O lower left abdominal pain x 2days.   Patient unable to sleep due to pain.   Patient originally thought maybe due to period but has not gone away despite period ending.   Sharp stabbing pain (9/10)  Denies n/v or diarrhea  Denies urinary sx or vaginal discharge    LMP ended 2 days.  Last BM this morning and normal for patient   Gall bladder remove a few years ago   A/Ox4 Ambulatory in triage

## 2018-08-13 NOTE — ED Notes (Addendum)
Pelvic cart materials are at bedside.

## 2022-09-30 ENCOUNTER — Ambulatory Visit
Admission: EM | Admit: 2022-09-30 | Discharge: 2022-09-30 | Disposition: A | Discharge status: Home / Self Care | Payer: Self-pay

## 2022-09-30 DIAGNOSIS — L255 Unspecified contact dermatitis due to plants, except food: Secondary | ICD-10-CM

## 2022-09-30 MED ORDER — METHYLPREDNISOLONE ACETATE 80 MG/ML IJ SUSP
80.0000 mg | Freq: Once | INTRAMUSCULAR | Status: AC
  Administered 2022-09-30: 80 mg via INTRAMUSCULAR

## 2022-09-30 MED ORDER — FLUCONAZOLE 150 MG PO TABS: 150.0000 mg | ORAL_TABLET | ORAL | 0 refills | Status: AC

## 2022-09-30 MED ORDER — HYDROXYZINE HCL 25 MG PO TABS: 12.5000 mg | ORAL_TABLET | Freq: Three times a day (TID) | ORAL | 0 refills | Status: AC | PRN
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
## 2022-09-30 NOTE — ED Triage Notes (Signed)
Pt states that she has poison ivy. She's taking prednisone 50 mg Clobetasol

## 2022-09-30 NOTE — Discharge Instructions (Addendum)
On Monday, start the prednisone 50mg  once daily with breakfast. Finish this and then start prednisone 20mg  once daily with breakfast for 5 days. Use hydroxyzine for itching as needed. Use fluconazole for the yeast infection that develops with steroid use.
